# Patient Record
Sex: Male | Born: 1975 | Race: White | Hispanic: No | Marital: Married | Smoking: Never smoker
Health system: Southern US, Community
[De-identification: ages and names within clinical notes are randomized; demographics above are authoritative.]

## PROBLEM LIST (undated history)

## (undated) DIAGNOSIS — F419 Anxiety disorder, unspecified: Secondary | ICD-10-CM

## (undated) DIAGNOSIS — K219 Gastro-esophageal reflux disease without esophagitis: Secondary | ICD-10-CM

## (undated) HISTORY — PX: ELBOW SURGERY: SHX618

## (undated) HISTORY — DX: Anxiety disorder, unspecified: F41.9

## (undated) HISTORY — DX: Gastro-esophageal reflux disease without esophagitis: K21.9

---

## 2007-08-24 ENCOUNTER — Emergency Department (HOSPITAL_COMMUNITY): Admission: EM | Admit: 2007-08-24 | Discharge: 2007-08-24 | Payer: Self-pay | Admitting: Family Medicine

## 2007-09-23 ENCOUNTER — Ambulatory Visit: Payer: Self-pay | Admitting: Family Medicine

## 2007-09-23 DIAGNOSIS — F411 Generalized anxiety disorder: Secondary | ICD-10-CM | POA: Insufficient documentation

## 2007-09-23 DIAGNOSIS — E781 Pure hyperglyceridemia: Secondary | ICD-10-CM | POA: Insufficient documentation

## 2007-10-04 ENCOUNTER — Telehealth: Payer: Self-pay | Admitting: Family Medicine

## 2007-11-14 ENCOUNTER — Emergency Department (HOSPITAL_COMMUNITY): Admission: EM | Admit: 2007-11-14 | Discharge: 2007-11-14 | Payer: Self-pay | Admitting: Family Medicine

## 2007-11-30 ENCOUNTER — Telehealth (INDEPENDENT_AMBULATORY_CARE_PROVIDER_SITE_OTHER): Payer: Self-pay | Admitting: *Deleted

## 2008-02-11 ENCOUNTER — Telehealth: Payer: Self-pay | Admitting: Family Medicine

## 2008-02-11 ENCOUNTER — Ambulatory Visit: Payer: Self-pay | Admitting: Family Medicine

## 2008-02-14 ENCOUNTER — Emergency Department (HOSPITAL_COMMUNITY): Admission: EM | Admit: 2008-02-14 | Discharge: 2008-02-14 | Payer: Self-pay | Admitting: Family Medicine

## 2008-02-16 ENCOUNTER — Telehealth: Payer: Self-pay | Admitting: Psychology

## 2008-03-22 ENCOUNTER — Ambulatory Visit: Payer: Self-pay | Admitting: Family Medicine

## 2008-03-22 ENCOUNTER — Encounter: Payer: Self-pay | Admitting: Psychology

## 2008-03-22 DIAGNOSIS — F319 Bipolar disorder, unspecified: Secondary | ICD-10-CM | POA: Insufficient documentation

## 2008-03-23 LAB — CONVERTED CEMR LAB: TSH: 1.009 microintl units/mL (ref 0.350–4.50)

## 2008-05-04 ENCOUNTER — Ambulatory Visit: Payer: Self-pay | Admitting: Family Medicine

## 2008-06-06 ENCOUNTER — Telehealth: Payer: Self-pay | Admitting: Family Medicine

## 2008-07-24 ENCOUNTER — Telehealth: Payer: Self-pay | Admitting: Family Medicine

## 2008-08-07 ENCOUNTER — Ambulatory Visit: Payer: Self-pay | Admitting: Family Medicine

## 2008-09-15 ENCOUNTER — Encounter: Payer: Self-pay | Admitting: Family Medicine

## 2008-09-19 ENCOUNTER — Ambulatory Visit: Payer: Self-pay | Admitting: Family Medicine

## 2008-09-19 DIAGNOSIS — N453 Epididymo-orchitis: Secondary | ICD-10-CM | POA: Insufficient documentation

## 2008-09-19 LAB — CONVERTED CEMR LAB
Bilirubin Urine: NEGATIVE
Blood in Urine, dipstick: NEGATIVE
Glucose, Urine, Semiquant: NEGATIVE
Specific Gravity, Urine: 1.01
WBC Urine, dipstick: NEGATIVE
pH: 7

## 2008-09-20 ENCOUNTER — Telehealth: Payer: Self-pay | Admitting: *Deleted

## 2008-09-28 ENCOUNTER — Encounter: Payer: Self-pay | Admitting: Family Medicine

## 2008-09-28 ENCOUNTER — Ambulatory Visit: Payer: Self-pay | Admitting: Family Medicine

## 2008-09-28 LAB — CONVERTED CEMR LAB
ALT: 15 units/L (ref 0–53)
AST: 20 units/L (ref 0–37)
Creatinine, Ser: 0.97 mg/dL (ref 0.40–1.50)
LDL Cholesterol: 127 mg/dL — ABNORMAL HIGH (ref 0–99)
Sodium: 138 meq/L (ref 135–145)
Total Bilirubin: 0.8 mg/dL (ref 0.3–1.2)
Total CHOL/HDL Ratio: 6
VLDL: 34 mg/dL (ref 0–40)

## 2008-09-29 ENCOUNTER — Encounter: Payer: Self-pay | Admitting: Family Medicine

## 2008-10-05 ENCOUNTER — Ambulatory Visit: Payer: Self-pay | Admitting: Family Medicine

## 2008-12-20 ENCOUNTER — Ambulatory Visit: Payer: Self-pay | Admitting: Family Medicine

## 2008-12-20 ENCOUNTER — Telehealth: Payer: Self-pay | Admitting: Family Medicine

## 2009-06-11 ENCOUNTER — Telehealth: Payer: Self-pay | Admitting: Family Medicine

## 2009-11-05 ENCOUNTER — Ambulatory Visit: Payer: Self-pay | Admitting: Family Medicine

## 2010-04-05 ENCOUNTER — Ambulatory Visit (HOSPITAL_COMMUNITY): Admission: RE | Admit: 2010-04-05 | Discharge: 2010-04-05 | Payer: Self-pay | Admitting: Family Medicine

## 2010-04-05 ENCOUNTER — Ambulatory Visit: Payer: Self-pay | Admitting: Family Medicine

## 2010-04-05 ENCOUNTER — Telehealth (INDEPENDENT_AMBULATORY_CARE_PROVIDER_SITE_OTHER): Payer: Self-pay | Admitting: *Deleted

## 2010-04-05 DIAGNOSIS — R1011 Right upper quadrant pain: Secondary | ICD-10-CM | POA: Insufficient documentation

## 2010-04-08 ENCOUNTER — Ambulatory Visit: Payer: Self-pay | Admitting: Family Medicine

## 2010-04-08 LAB — CONVERTED CEMR LAB
Albumin: 4.9 g/dL (ref 3.5–5.2)
Alkaline Phosphatase: 50 units/L (ref 39–117)
CO2: 29 meq/L (ref 19–32)
Chloride: 103 meq/L (ref 96–112)
Glucose, Bld: 87 mg/dL (ref 70–99)
Lipase: 29 units/L (ref 0–75)
Potassium: 5.5 meq/L — ABNORMAL HIGH (ref 3.5–5.3)
RBC: 5.61 M/uL (ref 4.22–5.81)
Sodium: 143 meq/L (ref 135–145)
Total Protein: 7.9 g/dL (ref 6.0–8.3)
WBC: 6.4 10*3/uL (ref 4.0–10.5)

## 2010-04-10 ENCOUNTER — Telehealth: Payer: Self-pay | Admitting: Family Medicine

## 2010-04-11 ENCOUNTER — Encounter: Admission: RE | Admit: 2010-04-11 | Discharge: 2010-04-11 | Payer: Self-pay | Admitting: Family Medicine

## 2010-04-12 ENCOUNTER — Telehealth: Payer: Self-pay | Admitting: Family Medicine

## 2010-04-12 ENCOUNTER — Encounter: Admission: RE | Admit: 2010-04-12 | Discharge: 2010-04-12 | Payer: Self-pay | Admitting: Family Medicine

## 2010-06-27 ENCOUNTER — Encounter: Payer: Self-pay | Admitting: Sports Medicine

## 2010-06-27 ENCOUNTER — Ambulatory Visit
Admission: RE | Admit: 2010-06-27 | Discharge: 2010-06-27 | Payer: Self-pay | Source: Home / Self Care | Attending: Family Medicine | Admitting: Family Medicine

## 2010-07-16 NOTE — Progress Notes (Signed)
Summary: results  Phone Note Call from Patient Call back at [phone]   Caller: Patient Summary of Call: wants to know results of labs Initial call taken by: De Nurse,  April 10, 2010 9:19 AM  Follow-up for Phone Call        Told results were normal.  He is having more pain but not severe and no fever.  Has [place] in AM.  Told if severe or fever go to ER otherwise will contact him tomorro Follow-up by: Pearlean Brownie MD,  April 10, 2010 3:27 PM

## 2010-07-16 NOTE — Assessment & Plan Note (Signed)
Summary: still having pain rad to back/bmc   Vital Signs:  Patient profile:   35 year old male Height:      68 inches Weight:      179.5 pounds BMI:     27.39 Temp:     98.9 degrees F oral Pulse rate:   76 / minute BP sitting:   126 / 85  (left arm) Cuff size:   regular  Vitals Entered By: Garen Grams LPN (April 08, 2010 3:02 PM) CC: still having abd pain Is Patient Diabetic? No Pain Assessment Patient in pain? yes     Location: abdomen Intensity: 5   Primary Care Blu Lori:  Pearlean Brownie MD  CC:  still having abd pain.  History of Present Illness: RUQ Pain continues.  Took laxative and had bowel movements but no change in pain.  Xray reviewed.  Continues constant epigastic and right upper quadrant discomfort sometimes worse than others.  Eating well with normal appetite no nausea or vomiting or diarrhea or fever or jaundice.  No unsual recent injestions or change in diet. No NSAIDS.  No family history of GB disease  Eats almost no fat  ROS - as above PMH - Medications reviewed and updated in medication list.  Smoking Status noted in VS form    Habits & Providers  Alcohol-Tobacco-Diet     Alcohol drinks/day: <1     Alcohol type: beer     Tobacco Status: never     Passive Smoke Exposure: no  Current Medications (verified): 1)  Alprazolam 0.5 Mg Tabs (Alprazolam) .Marland Kitchen.. 1 By Mouth Two Times A Day As Needed 2)  Nexium 20 Mg  Cpdr (Esomeprazole Magnesium) .Marland Kitchen.. 1 By Mouth Daily 3)  Lamictal 200 Mg Tabs (Lamotrigine) .... One Pill Daily. 4)  Tramadol Hcl 50 Mg Tabs (Tramadol Hcl) .Marland Kitchen.. 1-2 By Mouth Q 6 Hrs As Needed Pain  Allergies: 1)  ! Erythromycin  Physical Exam  General:  Well-developed,well-nourished,in no acute distress; alert,appropriate and cooperative throughout examination Eyes:  no jaundcie  Abdomen:  TTP in RUQ with + McMurrays sign.  No masses or gaurding or rebound    Impression & Recommendations:  Problem # 1:  RUQ PAIN  (ICD-789.01) suspect GB disease but also check for liver and pancreas and possible UGI bleed with anemia.   Orders: Comp Met-[facility] [phone]) CBC-[facility] (09811) Lipase-[facility] [phone]) Ultrasound (Ultrasound) [facility]- Est  Level 4 (13086)  Complete Medication List: 1)  Alprazolam 0.5 Mg Tabs (Alprazolam) .Marland Kitchen.. 1 by mouth two times a day as needed 2)  Nexium 20 Mg Cpdr (Esomeprazole magnesium) .Marland Kitchen.. 1 by mouth daily 3)  Lamictal 200 Mg Tabs (Lamotrigine) .... One pill daily. 4)  Tramadol Hcl 50 Mg Tabs (Tramadol hcl) .Marland Kitchen.. 1-2 by mouth q 6 hrs as needed pain  Patient Instructions: 1)  Call immediately if you get severe unremitting pain or fever or persistent nausea or vomiting 2)  I will call you with the results of your test  Prescriptions: TRAMADOL HCL 50 MG TABS (TRAMADOL HCL) 1-2 by mouth q 6 hrs as needed pain  #30 x 1   Entered and Authorized by:   Pearlean Brownie MD   Signed by:   Pearlean Brownie MD on 04/08/2010   Method used:   Electronically to        [facility]* (retail)       [address]       [address] Shipping/mailing  [place], [place]  [address]       Ph: [phone]       Fax: [phone]   RxID:   [id number]    Orders Added: 1)  Comp Met-[facility] [84132-44010] 2)  CBC-[facility] [85027] 3)  Lipase-[facility] [[id number]] 4)  Ultrasound [Ultrasound] 5)  [facility]- Est  Level 4 [27253]

## 2010-07-16 NOTE — Assessment & Plan Note (Signed)
Summary: cpe,df   Vital Signs:  Patient profile:   35 year old male Height:      67 inches Weight:      185.8 pounds BMI:     29.21 Temp:     98.8 degrees F oral Pulse rate:   66 / minute BP sitting:   120 / 78  (left arm) Cuff size:   regular  Vitals Entered By: Garen Grams LPN (Nov 05, 2009 9:49 AM) CC: cpe Is Patient Diabetic? No Pain Assessment Patient in pain? no        Primary Care Provider:  Pearlean Brownie MD  CC:  cpe.  History of Present Illness: Current Problems:  PREVENTIVE HEALTH CARE (ICD-V70.0) Feels well exercising daily. About to run a marathon  OTHER AND UNSPECIFIED BIPOLAR DISORDERS (ICD-296.89) under good control.  No anger episodes or violence.  Exercising helps. Takes lamictal daily  HYPERTRIGLYCERIDEMIA (ICD-272.1) last blood test was 199.  working on diet. No chest pain or claudication  ANXIETY STATE NOS (ICD-300.00) weaning down xanax to two times a day as needed.  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  Smoking Status noted in VS form      Habits & Providers  Alcohol-Tobacco-Diet     Tobacco Status: never  Current Medications (verified): 1)  Alprazolam 0.5 Mg Tabs (Alprazolam) .Marland Kitchen.. 1 By Mouth Two Times A Day As Needed 2)  Nexium 20 Mg  Cpdr (Esomeprazole Magnesium) .Marland Kitchen.. 1 By Mouth Daily 3)  Lamictal 200 Mg Tabs (Lamotrigine) .... One Pill Daily.  Allergies: 1)  ! Erythromycin  Social History: Works at [employer] at [employer] at [employer].  Dtrs Zoe 02 and [place] 06  Review of Systems  The patient denies anorexia, fever, weight gain, vision loss, decreased hearing, hoarseness, chest pain, syncope, dyspnea on exertion, peripheral edema, prolonged cough, headaches, hemoptysis, abdominal pain, melena, hematochezia, severe indigestion/heartburn, hematuria,  incontinence, genital sores, muscle weakness, suspicious skin lesions, transient blindness, difficulty walking, depression, unusual weight change, abnormal bleeding, enlarged lymph nodes, angioedema, and testicular masses.    Physical Exam  General:  Well-developed,well-nourished,in no acute distress; alert,appropriate and cooperative throughout examination Head:  Normocephalic and atraumatic without obvious abnormalities. No apparent alopecia or balding. Eyes:  No corneal or conjunctival inflammation noted. EOMI. Perrla. Funduscopic exam benign, without hemorrhages, exudates or papilledema. Vision grossly normal. Ears:  External ear exam shows no significant lesions or deformities.  Otoscopic examination reveals clear canals, tympanic membranes are intact bilaterally without bulging, retraction, inflammation or discharge. Hearing is grossly normal bilaterally. Nose:  External nasal examination shows no deformity or inflammation. Nasal mucosa are pink and moist without lesions or exudates. Mouth:  Oral mucosa and oropharynx without lesions or exudates.  Teeth in good repair. Neck:  No deformities, masses, or tenderness noted. Lungs:  Normal respiratory effort, chest expands symmetrically. Lungs are clear to auscultation, no crackles or wheezes. Heart:  Normal rate and regular rhythm. S1 and S2 normal without gallop, murmur, click, rub or other extra sounds. Abdomen:  Bowel sounds positive,abdomen soft and non-tender without masses, organomegaly or hernias noted. Msk:  No deformity or scoliosis noted of thoracic or lumbar spine.   Extremities:  No clubbing, cyanosis, edema, or deformity noted with normal full range of motion of all joints.   Skin:  Intact without suspicious lesions or rashes Cervical Nodes:  No lymphadenopathy noted  Psych:  Cognition and judgment appear intact. Alert and cooperative with normal attention span and concentration. No apparent delusions, illusions,  hallucinations   Impression & Recommendations:  Problem # 1:  PREVENTIVE HEALTH CARE (ICD-V70.0)  Healthy great work on losing weight (> 100 lbs).  His hypertension has resolved   Orders: [facility] - Est  18-39 yrs ([address])  Problem # 2:  ANXIETY STATE NOS (ICD-300.00) doing well will decrease xanax frequency  His updated medication list for this problem includes:    Alprazolam 0.5 Mg Tabs (Alprazolam) .Marland Kitchen... 1 by mouth two times a day as needed  Problem # 3:  OTHER AND UNSPECIFIED BIPOLAR DISORDERS (ICD-296.89) Stable on lamictal Discussed possible weaning in the future   Complete Medication List: 1)  Alprazolam 0.5 Mg Tabs (Alprazolam) .Marland Kitchen.. 1 by mouth two times a day as needed 2)  Nexium 20 Mg Cpdr (Esomeprazole magnesium) .Marland Kitchen.. 1 by mouth daily 3)  Lamictal 200 Mg Tabs (Lamotrigine) .... One pill daily.  Patient Instructions: 1)  Keep up the great work Prescriptions: ALPRAZOLAM 0.5 MG TABS (ALPRAZOLAM) 1 by mouth two times a day as needed  #60 x 6   Entered and Authorized by:   Pearlean Brownie MD   Signed by:   Pearlean Brownie MD on 11/05/2009   Method used:   Handwritten   RxID:   [id number]

## 2010-07-16 NOTE — Progress Notes (Signed)
Summary: Test Res  Phone Note Call from Patient Call back at [phone]   Caller: Patient Summary of Call: Pt checking on results of test from today? Initial call taken by: Clydell Hakim,  April 12, 2010 4:00 PM  Follow-up for Phone Call        will forward to MD. Follow-up by: Theresia Lo RN,  April 12, 2010 4:26 PM  Additional Follow-up for Phone Call Additional follow up Details #1::        Spoke with him about CT.  PAin is better but lots of diarreha.  Recommend pepto bismol 2 qid.  Call if fever or bleedingn or worse pain. or if not better by monday

## 2010-07-16 NOTE — Progress Notes (Signed)
Summary: triage  Phone Note Call from Patient Call back at [phone]   Caller: Patient Summary of Call: Intense stomach pain for 3 days asking to be seen today. Initial call taken by: Clydell Hakim,  April 05, 2010 9:12 AM  Follow-up for Phone Call         patient states pain is  right upper abdomen . rates pain at 5 now. wakes him up at night. vomited one time 3 nights ago and has had  nausea since. appointment scheduled today, Follow-up by: Theresia Lo RN,  April 05, 2010 9:43 AM

## 2010-07-16 NOTE — Assessment & Plan Note (Signed)
Summary: upper abdominal pain  nausea/ls   Vital Signs:  Patient profile:   35 year old male Height:      68 inches Weight:      182 pounds BMI:     27.77 Temp:     98.6 degrees F BP sitting:   116 / 77  (right arm) Cuff size:   regular  Vitals Entered By: Dennison Nancy RN (April 05, 2010 1:31 PM) CC: [place] for RUQ pain Is Patient Diabetic? No Pain Assessment Patient in pain? yes     Location: RUQ Intensity: 5 Onset of pain  This past Tuesday   Primary Care Provider:  Pearlean Brownie MD  CC:  [place] for RUQ pain.  History of Present Illness: 1. RUQ pain:  Pt presents to clinic for evaluation of his right upper abdominal pain.  He initally had some nausea and threw up once on Tuesday evening.  Shortly after that the pain started Tuesday night and woke him up from bed.  The pain is constant.  Described as a "knot" in his stomach.  It is about the same as it was on Tuesday.  Currently rated a 4-5/10.  He hasn't had a BM since Sunday.  He normally has BM's everyday.   He is still eating and drinking normally and has an appetite.  Nothing makes it better.  Nothing makes it worse.  ROS: denies fevers, GERD, diarrhea, chest pain, shortness of breath, back pain, dysuria, hematuria.   Habits & Providers  Alcohol-Tobacco-Diet     Tobacco Status: never  Current Medications (verified): 1)  Alprazolam 0.5 Mg Tabs (Alprazolam) .Marland Kitchen.. 1 By Mouth Two Times A Day As Needed 2)  Nexium 20 Mg  Cpdr (Esomeprazole Magnesium) .Marland Kitchen.. 1 By Mouth Daily 3)  Lamictal 200 Mg Tabs (Lamotrigine) .... One Pill Daily. 4)  Magnesium Citrate 1.745 Gm/52ml Soln (Magnesium Citrate) .... Drink One Bottle Daily Until Bowel Movement  Allergies: 1)  ! Erythromycin  Past History:  Past Medical History: Reviewed history from 09/23/2007 and no changes required. 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 Works at [employer] at [employer] at [employer].  Dtrs Zoe 02 and [place] 06  Physical Exam  General:  Vitals reviewed.  Afebrile.  Comfortable appearing.  Walking into clinic without difficulty.  No acute distress. Lungs:  Normal respiratory effort, chest expands symmetrically. Lungs are clear to auscultation, no crackles or wheezes. Heart:  Normal rate and regular rhythm. S1 and S2 normal without gallop, murmur, click, rub or other extra sounds. Abdomen:  Non-distended.  No gross deformity.  TTP in RUQ.  Otherwise non-tender.  Normal bowel sounds.  No guarding. soft, no rigidity, no rebound tenderness, and no hepatomegaly.   Neurologic:  alert & oriented X3 and gait normal.   Skin:  turgor normal, color normal, and no rashes.   Psych:  not anxious appearing.     Impression & Recommendations:  Problem # 1:  RUQ PAIN (ICD-789.01) Assessment New DDX includes constipation, gallbladder, or liver pathology.  Working diagnosis is constipation since he hasn't had a BM in nearly a week and he is normally regular.  Will get abdominal x-ray to help confirm diagnosis.  Will attempt to get bowel moving with Mag Citrate.  If not better with BM  would consider RUQ u/s or CT scan to evaluate biliary tree.  Precautions for worsening symptoms given to pt. Orders: Diagnostic X-Ray/Fluoroscopy (Diagnostic X-Ray/Flu) [facility]- Est  Level 4 (88416)  Complete Medication List: 1)  Alprazolam 0.5 Mg Tabs (Alprazolam) .Marland Kitchen.. 1 by mouth two times a day as needed 2)  Nexium 20 Mg Cpdr (Esomeprazole magnesium) .Marland Kitchen.. 1 by mouth daily 3)  Lamictal 200 Mg Tabs (Lamotrigine) .... One pill daily. 4)  Magnesium Citrate 1.745 Gm/26ml Soln (Magnesium citrate) .... Drink one bottle daily until bowel movement  Patient Instructions: 1)  There are a couple things that this pain could be. 2)  Right now, I  am thinking that it is related to constipation 3)  I would like to see how you feel after having a couple bowel movements 4)  If not better with a bowel movement please return to clinic 5)  If you start developing worsening abdominal pain or if you are unable to eat or drink then please return to clinic or go to the ED 6)  Please schedule a follow up early next week to check on you   Orders Added: 1)  Diagnostic X-Ray/Fluoroscopy [Diagnostic X-Ray/Flu] 2)  [facility]- Est  Level 4 [60630]

## 2010-07-18 ENCOUNTER — Ambulatory Visit (INDEPENDENT_AMBULATORY_CARE_PROVIDER_SITE_OTHER): Payer: BC Managed Care – PPO | Admitting: Family Medicine

## 2010-07-18 ENCOUNTER — Encounter: Payer: Self-pay | Admitting: Family Medicine

## 2010-07-18 DIAGNOSIS — R079 Chest pain, unspecified: Secondary | ICD-10-CM | POA: Insufficient documentation

## 2010-07-18 NOTE — Assessment & Plan Note (Signed)
Summary: reocurring pain under rt rib cage/chambliss/bmc   Vital Signs:  Patient profile:   35 year old male Height:      68 inches Weight:      184 pounds Temp:     98.3 degrees F oral Pulse rate:   66 / minute Pulse rhythm:   regular BP sitting:   131 / 80  (right arm) Cuff size:   regular  Vitals Entered By: Loralee Pacas CMA (June 27, 2010 3:10 PM) CC: continued pain in rib cage Pain Assessment Patient in pain? yes     Location: rib Intensity: 7 Type: sharp Onset of pain  Constant Comments pt stated that he was seen here in oct for the same thing and it went away but the pain has started up again   Primary Care Provider:  Pearlean Brownie MD  CC:  continued pain in rib cage.  History of Present Illness: 35 yo male with RUQ pain.  This has been present  ~ 2 months now, has had labs, [place], CT abdomen all which did not reveal a source of his pain.  He has no N/V/D/C currently.  Doesn't note anything other than palpation that worsens his pain.  It is not affected by deep breathing.  He does work out every single day and has lost approx 80 pounds doing so.  This does not exacerbate his pain either.  He has been taking tramadol which doesn't help.  No fevers/chills, no rashes.  Habits & Providers  Alcohol-Tobacco-Diet     Alcohol drinks/day: <1     Alcohol type: beer     Tobacco Status: never     Passive Smoke Exposure: no  Exercise-Depression-Behavior     Have you felt down or hopeless? no     Have you felt little pleasure in things? no     Depression Counseling: not indicated; screening negative for depression     Seat Belt Use: always  Current Medications (verified): 1)  Alprazolam 0.5 Mg Tabs (Alprazolam) .Marland Kitchen.. 1 By Mouth Two Times A Day As Needed 2)  Nexium 20 Mg  Cpdr (Esomeprazole Magnesium) .Marland Kitchen.. 1 By Mouth Daily 3)  Lamictal 200 Mg Tabs (Lamotrigine) .... One Pill Daily. 4)  Mobic 15 Mg Tabs (Meloxicam) .... One Tab By Mouth Daily For Pain.  Allergies  (verified): 1)  ! Erythromycin  Social History: Risk analyst Use:  always  Review of Systems       See HPI  Physical Exam  General:  Well-developed,well-nourished,in no acute distress; alert,appropriate and cooperative throughout examination Abdomen:  Noted pain in RUQ.  On further evaluation pain is located directly over his 10th costal cartilage and neither just below over the soft tissue/liver/GB, nor just above over the intercostal muscle.  Otherwise neg murphys sign, abd soft, NT, good BS, no masses.   Impression & Recommendations:  Problem # 1:  RUQ PAIN (ICD-789.01) Assessment Unchanged Pain is directly over the costal cartilage. Suggestive of costochondritis, this can take up to 12 weeks to resolve. CT abd, exam, and labs rule out life-threatening visceral pathology. Mobic 15mg  daily. RTC if no better in 1-2 weeks.  Orders: [facility]- Est Level  3 ([address]40)  Complete Medication List: 1)  Alprazolam 0.5 Mg Tabs (Alprazolam) .Marland Kitchen.. 1 by mouth two times a day as needed 2)  Nexium 20 Mg Cpdr (Esomeprazole magnesium) .Marland Kitchen.. 1 by mouth daily 3)  Lamictal 200 Mg Tabs (Lamotrigine) .... One pill daily. 4)  Mobic 15 Mg Tabs (Meloxicam) .... One tab  by mouth daily for pain.  Patient Instructions: 1)  See handout. 2)  Pain should respond to mobic. 3)  Come back if no better in a week with daily medication. 4)  -Dr. Karie Schwalbe. Prescriptions: MOBIC 15 MG TABS (MELOXICAM) One tab by mouth daily for pain.  #14 x 0   Entered and Authorized by:   Rodney Langton MD   Signed by:   Rodney Langton MD on 06/27/2010   Method used:   Electronically to        [facility]* (retail)       [address]       [address]. Shipping/mailing       [place], [place]  [address]       Ph: [phone]       Fax: [phone]   RxID:   [id number]    Orders Added: 1)  [facility]- Est Level  3 [99213]  Appended Document: reocurring pain under rt rib cage/chambliss/bmc I would like him  to continue working out, he should however limit abdominal exercises/crunches, and exercises that would involve protraction of the scapula such as pushups and bench press as this would cause serratus anterior pull on the ribs.

## 2010-07-18 NOTE — Letter (Signed)
Summary: Handout Printed  Printed Handout:  - Costochondritis 

## 2010-07-24 NOTE — Assessment & Plan Note (Signed)
Summary: side pain,df   Vital Signs:  Patient profile:   35 year old male Height:      68 inches Weight:      186.8 pounds BMI:     28.51 Temp:     98.2 degrees F oral Pulse rate:   64 / minute BP sitting:   131 / 78  (left arm) Cuff size:   regular  Vitals Entered By: Jimmy Footman, CMA (July 18, 2010 2:40 PM) CC: still having right sided pain Is Patient Diabetic? No Pain Assessment Patient in pain? yes        Primary Provider:  Pearlean Brownie MD  CC:  still having right sided pain.  History of Present Illness: 1. Right Rib Pain patient has diagnosis of costochondritis from a visit one week ago. Dr. Karie Schwalbe used Mobic to help decrease inflammation and treat pain. The patient states this is not helping. He has had this pain off and on since october with prior imaging that doesnt reveal any pathology that could cuase this pain. He has a 2 mm rt. ureteral calculi, but that is not the cause. On exam he has focal tenderness at the costo-chondral junction of the 10th true rib. 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  Allergies: 1)  ! Erythromycin  Review of Systems       no fever, no arthralgias, no tick bites, no rashs. see HPI  Physical Exam  Abdomen:  Bowel sounds positive,abdomen soft and non-tender without masses, organomegaly or hernias noted. 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 F/U in a week.  Orders: [facility]- Est Level  3 ([address]86)  Complete Medication List: 1)  Alprazolam 0.5 Mg Tabs (Alprazolam) .Marland Kitchen.. 1 by mouth two times a day as needed 2)  Nexium 20 Mg Cpdr  (Esomeprazole magnesium) .Marland Kitchen.. 1 by mouth daily 3)  Lamictal 200 Mg Tabs (Lamotrigine) .... One pill daily. 4)  Mobic 15 Mg Tabs (Meloxicam) .... One tab by mouth daily for pain. 5)  Prednisone 20 Mg Tabs (Prednisone) .... Take 3 tabs by mouth daily for 5 days 6)  Synera 70-70 Mg Ptch (Lidocaine-tetracaine) .... Apply patch to right ribcage every 12 hours as needed pain Prescriptions: SYNERA 70-70 MG PTCH (LIDOCAINE-TETRACAINE) apply patch to right ribcage every 12 hours as needed pain  #28 patches x 0   Entered and Authorized by:   Edd Arbour   Signed by:   Edd Arbour on 07/18/2010   Method used:   Electronically to        [facility]* (retail)       [address]       [address] Shipping/mailing       [place], [place]  [address]       Ph: [phone]       Fax: [phone]   RxID:   [id number] PREDNISONE 20 MG TABS (PREDNISONE) take 3 tabs by mouth daily for 5 days  #15 tabs x 0   Entered and Authorized by:   Edd Arbour   Signed by:   Edd Arbour on  07/18/2010   Method used:   Electronically to        [facility]* (retail)       [address]       [address] Shipping/mailing       [place], [place]  [address]       Ph: [phone]       Fax: [phone]   RxID:   [id number]    Orders Added: 1)  [facility]- Est Level  3 [[address]132]

## 2010-08-02 ENCOUNTER — Ambulatory Visit (INDEPENDENT_AMBULATORY_CARE_PROVIDER_SITE_OTHER): Payer: BC Managed Care – PPO | Admitting: Family Medicine

## 2010-08-02 ENCOUNTER — Encounter: Payer: Self-pay | Admitting: Family Medicine

## 2010-08-02 VITALS — BP 148/84 | HR 64 | Temp 98.3°F | Ht 66.75 in | Wt 185.0 lb

## 2010-08-02 DIAGNOSIS — J069 Acute upper respiratory infection, unspecified: Secondary | ICD-10-CM | POA: Insufficient documentation

## 2010-08-02 MED ORDER — BENZONATATE 100 MG PO CAPS: 100.0000 mg | ORAL_CAPSULE | Freq: Four times a day (QID) | ORAL | Status: DC | PRN

## 2010-08-02 MED ORDER — AMOXICILLIN 500 MG PO CAPS: 500.0000 mg | ORAL_CAPSULE | Freq: Two times a day (BID) | ORAL | Status: AC

## 2010-08-02 NOTE — Progress Notes (Signed)
  Subjective:    Patient ID: Dominic Gay, male    DOB: [birth year], 35 y.o.   MRN: [id number]  URI  This is a new problem. The current episode started in the past 7 days. The problem has been unchanged. The maximum temperature recorded prior to his arrival was 101 - 101.9 F. The fever has been present for 1 to 2 days. Associated symptoms include congestion, coughing, headaches, rhinorrhea and sinus pain. Pertinent negatives include no abdominal pain, chest pain, diarrhea, dysuria, ear pain, joint pain, joint swelling, nausea, neck pain, plugged ear sensation, rash, sneezing, sore throat, swollen glands, vomiting or wheezing. Treatments tried: Dayquil/Nyquil. The treatment provided mild relief.      Review of Systems  HENT: Positive for congestion and rhinorrhea. Negative for ear pain, sore throat, sneezing and neck pain.   Respiratory: Positive for cough. Negative for wheezing.   Cardiovascular: Negative for chest pain.  Gastrointestinal: Negative for nausea, vomiting, abdominal pain and diarrhea.  Genitourinary: Negative for dysuria.  Musculoskeletal: Negative for joint pain.  Skin: Negative for rash.  Neurological: Positive for headaches.       Objective:   Physical Exam Gen: vitals reviewed, NAD, well appearing Head: Minimal sinus tenderness Nose: clear rhinorrhea Mouth: Tonsils swollen bilaterally.  No exudate Neck: shotty anterior cervical LAD Resp: CTAB, nml WOB CV: RRR no murmur Abd: S / NT / ND  +BS Ext: No c/c/e       Assessment & Plan:

## 2010-08-02 NOTE — Assessment & Plan Note (Signed)
Likely viral URI.  His daughter was just diagnosed with strep throat and he did have swollen tonsils.  He also has some sinus tenderness.  Will treat symptomatically for now.  Provided Rx for abx. If not better by next week.

## 2010-09-17 ENCOUNTER — Other Ambulatory Visit: Payer: Self-pay | Admitting: Family Medicine

## 2010-09-17 NOTE — Telephone Encounter (Signed)
Refill request

## 2010-09-26 ENCOUNTER — Encounter: Payer: Self-pay | Admitting: Family Medicine

## 2010-09-26 ENCOUNTER — Ambulatory Visit (INDEPENDENT_AMBULATORY_CARE_PROVIDER_SITE_OTHER): Payer: BC Managed Care – PPO | Admitting: Family Medicine

## 2010-09-26 VITALS — BP 141/89 | HR 88 | Temp 98.5°F | Ht 68.0 in | Wt 187.2 lb

## 2010-09-26 DIAGNOSIS — M79673 Pain in unspecified foot: Secondary | ICD-10-CM | POA: Insufficient documentation

## 2010-09-26 DIAGNOSIS — M79609 Pain in unspecified limb: Secondary | ICD-10-CM

## 2010-09-26 NOTE — Assessment & Plan Note (Signed)
2/2 calcaneal stress reaction vs stress fracture.  Nondisplaced.  Given management will not change to discern between the two, will not proceed with MRI or bone scan unless he does not improve as expected.  Cam walker for comfort with heel lift - offered crutches but he declined these.  No running in meantime.  Icing, elevation, tylenol for pain.  F/u in 2-3 weeks for reevaluation, repeat ultrasound.  Depending on exam at that time may start strengthening exercises, cross training.  See instructions for further.

## 2010-09-26 NOTE — Patient Instructions (Signed)
Your exam and ultrasound are consistent with a stress reaction/fracture of your calcaneus. Wear the boot regularly when up and walking around - do not have to sleep in this. Ice the area 15 minutes at a time 3-4 times a day. Elevate above the level of your heart when possible to help with swelling. No running or lower extremity exercise while we allow this to heal. Tylenol as needed for pain. Can use crutches if unable to bear weight comfortably with the boot. Follow up with me in 2-3 weeks for a recheck on your progress.

## 2010-09-26 NOTE — Progress Notes (Signed)
Subjective:    Patient ID: Dominic Gay, male    DOB: [birth year], 35 y.o.   MRN: [id number]  HPI 35 yo M here with left heel pain  Patient reports that about 2 weeks ago he was running in barefoot shoes (noticed he is a heel striker) when he started to develop mostly lateral left heel pain that significantly worsened after his run. Is training for a race on April 28th. Tried to run on a treadmill once after this on the next day but was unable to do so because of sharp pain in this area. + swelling but no bruising. Has been limping since with mild improvement Has been icing and taking some ibuprofen. Pain not worse with any specific ankle motions. He runs about 5 miles a day every day (roughly 35 miles a week) but new to using barefoot shoes. Has not exercised since the day after this started hurting.  Past Medical History  Diagnosis Date  . Anxiety   . GERD (gastroesophageal reflux disease)     Current Outpatient Prescriptions on File Prior to Visit  Medication Sig Dispense Refill  . ALPRAZolam (XANAX) 0.5 MG tablet 1 by mouth two times a day as needed       . esomeprazole (NEXIUM) 20 MG capsule Take 20 mg by mouth daily.        Marland Kitchen lamoTRIgine (LAMICTAL) 200 MG tablet Take 200 mg by mouth daily.        . traMADol (ULTRAM) 50 MG tablet Take 1 tablet (50 mg total) by mouth every 6 (six) hours as needed for pain.  60 tablet  1  . DISCONTD: benzonatate (TESSALON PERLES) 100 MG capsule Take 1 capsule (100 mg total) by mouth every 6 (six) hours as needed for Cough.  30 capsule  0  . DISCONTD: meloxicam (MOBIC) 15 MG tablet Take 15 mg by mouth daily.          History reviewed. No pertinent past surgical history.  Allergies  Allergen Reactions  . Erythromycin     History   Social History  . Marital Status: Married    Spouse Name: N/A    Number of Children: N/A  . Years of Education: N/A   Occupational History  . Not on file.   Social History Main Topics  . Smoking  status: Never Smoker   . Smokeless tobacco: Never Used  . Alcohol Use: Not on file  . Drug Use: Not on file  . Sexually Active: Not on file   Other Topics Concern  . Not on file   Social History Narrative  . No narrative on file    Family History  Problem Relation Age of Onset  . Diabetes Neg Hx   . Heart attack Neg Hx   . Hypertension Neg Hx     BP 141/89  Pulse 88  Temp(Src) 98.5 F (36.9 C) (Oral)  Ht 5\' 8"  (1.727 m)  Wt 187 lb 3.2 oz (84.913 kg)  BMI 28.46 kg/m2  Review of Systems See HPI above.    Objective:   Physical Exam Gen: NAD L ankle/foot: Mild swelling lateral ankle compared to right (over lateral calcaneus).  No other deformity, bruising, erythema. Mod-severe TTP lateral calcaneus with mild TTP peroneal tendons as cross lat malleolus.  No TTP plantar fascia, plantar medial insertion of plantar fascia.  No TTP base 5th, navicular, med/lat malleoli, fibular head. FROM ankle with 5/5 strength.  No pain with resisted ankle motions. + squeeze test. Unable to  do hop test.     MSK u/s: L ankle shows mild bony irregularity lateral calcaneus not present on R.  Significant neovascularity down to cortex of calcaneus on left also not seen on right ankle on doppler.  No displacement of bone visualized.  Assessment & Plan:  1. L heel pain - 2/2 calcaneal stress reaction vs stress fracture.  Nondisplaced.  Given management will not change to discern between the two, will not proceed with MRI or bone scan unless he does not improve as expected.  Cam walker for comfort with heel lift - offered crutches but he declined these.  No running in meantime.  Icing, elevation, tylenol for pain.  F/u in 2-3 weeks for reevaluation, repeat ultrasound.  Depending on exam at that time may start strengthening exercises, cross training.  See instructions for further.

## 2010-10-17 ENCOUNTER — Ambulatory Visit: Payer: BC Managed Care – PPO | Admitting: Family Medicine

## 2010-10-18 ENCOUNTER — Encounter: Payer: Self-pay | Admitting: Family Medicine

## 2010-10-18 ENCOUNTER — Ambulatory Visit (INDEPENDENT_AMBULATORY_CARE_PROVIDER_SITE_OTHER): Payer: BC Managed Care – PPO | Admitting: Family Medicine

## 2010-10-18 VITALS — BP 110/72 | HR 71 | Ht 68.0 in | Wt 180.0 lb

## 2010-10-18 DIAGNOSIS — M79673 Pain in unspecified foot: Secondary | ICD-10-CM

## 2010-10-18 DIAGNOSIS — M79609 Pain in unspecified limb: Secondary | ICD-10-CM

## 2010-10-18 NOTE — Patient Instructions (Signed)
You are healing well from your calcaneal stress fracture/reaction. May 24th will be 6 weeks - follow up with me at or just after this date. Continue with icing after workouts. Tylenol as needed. No running, jogging, elliptical for exercise. Ok to bike, lift weights, swim at this point If pain gets more than a 3/10, stop doing that exercise. Do theraband exercises and calf raises 3 sets of 10 once a day for 4-5 days a week.

## 2010-10-18 NOTE — Progress Notes (Signed)
Subjective:    Patient ID: Dominic Gay, male    DOB: [birth year], 35 y.o.   MRN: [id number]  HPI  35 yo M here for 3 week f/u left calcaneal stress fracture.  Last OV: Patient reports that about 2 weeks ago he was running in barefoot shoes (noticed he is a heel striker) when he started to develop mostly lateral left heel pain that significantly worsened after his run. Is training for a race on April 28th. Tried to run on a treadmill once after this on the next day but was unable to do so because of sharp pain in this area. + swelling but no bruising. Has been limping since with mild improvement Has been icing and taking some ibuprofen. Pain not worse with any specific ankle motions. He runs about 5 miles a day every day (roughly 35 miles a week) but new to using barefoot shoes. Has not exercised since the day after this started hurting.  Today: Patient reports he is about 80% improved. Has not done any running or cross training Required cam walker with heel lift until last weekend - now walking without pain without it Stopped icing 2 weeks ago Takes tylenol as needed. Belongs to a gym and can cross train when allowed.  Past Medical History  Diagnosis Date  . Anxiety   . GERD (gastroesophageal reflux disease)     Current Outpatient Prescriptions on File Prior to Visit  Medication Sig Dispense Refill  . ALPRAZolam (XANAX) 0.5 MG tablet 1 by mouth two times a day as needed       . esomeprazole (NEXIUM) 20 MG capsule Take 20 mg by mouth daily.        Marland Kitchen lamoTRIgine (LAMICTAL) 200 MG tablet Take 200 mg by mouth daily.        . traMADol (ULTRAM) 50 MG tablet Take 1 tablet (50 mg total) by mouth every 6 (six) hours as needed for pain.  60 tablet  1    History reviewed. No pertinent past surgical history.  Allergies  Allergen Reactions  . Erythromycin     History   Social History  . Marital Status: Married    Spouse Name: N/A    Number of Children: N/A  . Years of  Education: N/A   Occupational History  . Not on file.   Social History Main Topics  . Smoking status: Never Smoker   . Smokeless tobacco: Never Used  . Alcohol Use: Not on file  . Drug Use: Not on file  . Sexually Active: Not on file   Other Topics Concern  . Not on file   Social History Narrative  . No narrative on file    Family History  Problem Relation Age of Onset  . Diabetes Neg Hx   . Heart attack Neg Hx   . Hypertension Neg Hx     BP 110/72  Pulse 71  Ht 5\' 8"  (1.727 m)  Wt 180 lb (81.647 kg)  BMI 27.37 kg/m2  Review of Systems  See HPI above.    Objective:   Physical Exam  Gen: NAD L ankle/foot: No swelling, deformity, bruising, erythema. Mild TTP lateral calcaneus - significantly improved. no TTP peroneal tendons, plantar fascia, plantar medial insertion of plantar fascia.  No TTP base 5th, navicular, med/lat malleoli, fibular head. FROM ankle. Negative squeeze test. Did not retest hop test.     MSK u/s: L ankle shows smoothing of prior bony irregularity lateral calcaneus though not completely healed.  Neovascularity no longer present at bony cortex - much improved.  No displacement of bone visualized.  Assessment & Plan:  1. L heel pain - 2/2 calcaneal stress reaction vs stress fracture.  Nondisplaced.  Much improved but still with mild pain.  Anticipate total 6-8 weeks for complete healing.  Start ankle exercises and calf raises for strengthening.  Cross train with swimming, biking but still no running, jogging, or elliptical.  Ice after activity.  Tylenol as needed for pain.  F/u when 6 weeks out from diagnosis for reevaluation.  Hopefully can institute walk/jog program at that time.

## 2010-10-18 NOTE — Assessment & Plan Note (Signed)
2/2 calcaneal stress reaction vs stress fracture.  Nondisplaced.  Much improved but still with mild pain.  Anticipate total 6-8 weeks for complete healing.  Start ankle exercises and calf raises for strengthening.  Cross train with swimming, biking but still no running, jogging, or elliptical.  Ice after activity.  Tylenol as needed for pain.  F/u when 6 weeks out from diagnosis for reevaluation.  Hopefully can institute walk/jog program at that time.

## 2010-11-07 ENCOUNTER — Ambulatory Visit (INDEPENDENT_AMBULATORY_CARE_PROVIDER_SITE_OTHER): Payer: BC Managed Care – PPO | Admitting: Family Medicine

## 2010-11-07 ENCOUNTER — Encounter: Payer: Self-pay | Admitting: Family Medicine

## 2010-11-07 VITALS — BP 128/78 | HR 64 | Temp 98.1°F | Ht 68.0 in | Wt 180.0 lb

## 2010-11-07 DIAGNOSIS — M79673 Pain in unspecified foot: Secondary | ICD-10-CM

## 2010-11-07 DIAGNOSIS — M79609 Pain in unspecified limb: Secondary | ICD-10-CM

## 2010-11-07 NOTE — Assessment & Plan Note (Signed)
Calcaneal stress fracture - now completely healed without recurrence of pain.  Discussed increasing activities by 10% each week.  Cross train on off days.  Ice, tylenol as needed.  Wearing more supportive running shoe now.  F/u prn.

## 2010-11-07 NOTE — Progress Notes (Signed)
Subjective:    Patient ID: Dominic Gay, male    DOB: [birth year], 35 y.o.   MRN: [id number]  HPI  35 yo M here for 6 1/2 week f/u left calcaneal stress fracture.  Initial OV: Patient reports that about 2 weeks ago he was running in barefoot shoes (noticed he is a heel striker) when he started to develop mostly lateral left heel pain that significantly worsened after his run. Is training for a race on April 28th. Tried to run on a treadmill once after this on the next day but was unable to do so because of sharp pain in this area. + swelling but no bruising. Has been limping since with mild improvement Has been icing and taking some ibuprofen. Pain not worse with any specific ankle motions. He runs about 5 miles a day every day (roughly 35 miles a week) but new to using barefoot shoes. Has not exercised since the day after this started hurting.  Last OV 3 weeks ago: Patient reports he is about 80% improved. Has not done any running or cross training Required cam walker with heel lift until last weekend - now walking without pain without it Stopped icing 2 weeks ago Takes tylenol as needed. Belongs to a gym and can cross train when allowed.  Today: Patient now 6 1/2 weeks out from calcaneal stress fracture. Reports no complaints. Had been cross training a little bit but now back to running this past week Able to run 2 miles at a time each day without recurrence of symptoms, swelling, or bruising. Not requiring any medicines or icing. Wearing supportive shoes now instead of barefoot shoes.  Past Medical History  Diagnosis Date  . Anxiety   . GERD (gastroesophageal reflux disease)     Current Outpatient Prescriptions on File Prior to Visit  Medication Sig Dispense Refill  . ALPRAZolam (XANAX) 0.5 MG tablet 1 by mouth two times a day as needed       . esomeprazole (NEXIUM) 20 MG capsule Take 20 mg by mouth daily.        Marland Kitchen lamoTRIgine (LAMICTAL) 200 MG tablet Take 200 mg by  mouth daily.        . traMADol (ULTRAM) 50 MG tablet Take 1 tablet (50 mg total) by mouth every 6 (six) hours as needed for pain.  60 tablet  1    History reviewed. No pertinent past surgical history.  Allergies  Allergen Reactions  . Erythromycin     History   Social History  . Marital Status: Married    Spouse Name: N/A    Number of Children: N/A  . Years of Education: N/A   Occupational History  . Not on file.   Social History Main Topics  . Smoking status: Never Smoker   . Smokeless tobacco: Never Used  . Alcohol Use: Not on file  . Drug Use: Not on file  . Sexually Active: Not on file   Other Topics Concern  . Not on file   Social History Narrative  . No narrative on file    Family History  Problem Relation Age of Onset  . Diabetes Neg Hx   . Heart attack Neg Hx   . Hypertension Neg Hx     BP 128/78  Pulse 64  Temp(Src) 98.1 F (36.7 C) (Oral)  Ht 5\' 8"  (1.727 m)  Wt 180 lb (81.647 kg)  BMI 27.37 kg/m2  Review of Systems  See HPI above.    Objective:  Physical Exam  Gen: NAD L ankle/foot: No swelling, deformity, bruising, erythema. No TTP lateral calcaneus. no TTP peroneal tendons, plantar fascia, plantar medial insertion of plantar fascia.  No TTP base 5th, navicular, med/lat malleoli, fibular head. FROM ankle. Negative squeeze test. Able to do calf raise and hop without pain.     Assessment & Plan:  1. Calcaneal stress fracture - now completely healed without recurrence of pain.  Discussed increasing activities by 10% each week.  Cross train on off days.  Ice, tylenol as needed.  Wearing more supportive running shoe now.  F/u prn.

## 2010-12-16 ENCOUNTER — Other Ambulatory Visit: Payer: Self-pay | Admitting: Family Medicine

## 2010-12-16 NOTE — Telephone Encounter (Signed)
Refill request

## 2010-12-17 NOTE — Telephone Encounter (Signed)
Phoned in.

## 2010-12-17 NOTE — Telephone Encounter (Signed)
Please phone into pharmacy. Thanks.  

## 2010-12-17 NOTE — Telephone Encounter (Signed)
Refill request for patient of Dr. Deirdre Priest.

## 2011-02-20 ENCOUNTER — Encounter: Payer: Self-pay | Admitting: Family Medicine

## 2011-02-20 ENCOUNTER — Ambulatory Visit (INDEPENDENT_AMBULATORY_CARE_PROVIDER_SITE_OTHER): Payer: BC Managed Care – PPO | Admitting: Family Medicine

## 2011-02-20 VITALS — BP 132/88 | HR 66 | Temp 98.0°F | Ht 69.0 in | Wt 185.0 lb

## 2011-02-20 DIAGNOSIS — J069 Acute upper respiratory infection, unspecified: Secondary | ICD-10-CM

## 2011-02-20 MED ORDER — AZITHROMYCIN 500 MG PO TABS: 500.0000 mg | ORAL_TABLET | Freq: Every day | ORAL | Status: AC

## 2011-02-20 MED ORDER — AZITHROMYCIN 500 MG PO TABS: 500.0000 mg | ORAL_TABLET | Freq: Every day | ORAL | Status: DC

## 2011-02-20 NOTE — Progress Notes (Deleted)
  Subjective:    Patient ID: Dominic Gay, male    DOB: 12-Feb-1976, 34 y.o.   MRN: [id number]  HPI    Review of Systems     Objective:   Physical Exam        Assessment & Plan:

## 2011-02-20 NOTE — Progress Notes (Signed)
  Subjective:     Dominic Gay is a 35 y.o. male who presents for evaluation of symptoms of a URI. Symptoms include congestion and cough described as nonproductive. Onset of symptoms was 3 weeks ago, and has been gradually worsening since that time. Treatment to date: decongestants.  The following portions of the patient's history were reviewed and updated as appropriate: allergies, current medications, past family history, past medical history, past social history, past surgical history and problem list.  Review of Systems Pertinent items are noted in HPI.   Objective:    BP 132/88  Pulse 66  Temp(Src) 98 F (36.7 C) (Oral)  Ht 5\' 9"  (1.753 m)  Wt 185 lb (83.915 kg)  BMI 27.32 kg/m2  General Appearance:    Alert, cooperative, no distress, appears stated age  Head:    Normocephalic, without obvious abnormality, atraumatic  Eyes:    PERRL, conjunctiva/corneas clear, EOM's intact, fundi    benign, both eyes       Ears:    Normal TM's and external ear canals, both ears  Nose:   Appears congested  Throat:   Lips, mucosa, and tongue normal; teeth and gums normal  Neck:   Supple, symmetrical, trachea midline, no adenopathy;       thyroid:  No enlargement/tenderness/nodules; no carotid   bruit or JVD     Lungs:     Clear to auscultation bilaterally, respirations unlabored  Chest wall:    No tenderness or deformity                       Lymph nodes:   Cervical, supraclavicular, and axillary nodes normal        Assessment:    viral upper respiratory illness  possibly atypical bacterial infection  Plan:    Discussed diagnosis and treatment of URI. Suggested symptomatic OTC remedies. Nasal saline spray for congestion. Zithromax per orders. Follow up as needed.

## 2011-02-20 NOTE — Progress Notes (Signed)
Addended by: Edd Arbour on: 02/20/2011 03:31 PM   Modules accepted: Orders

## 2011-02-20 NOTE — Patient Instructions (Signed)
Drink plenty of fluids, and get adequate rest. If your symptoms persist after antibiotic course, please come back and see me.

## 2011-03-10 LAB — POCT RAPID STREP A: Streptococcus, Group A Screen (Direct): NEGATIVE

## 2011-06-11 ENCOUNTER — Ambulatory Visit: Payer: BC Managed Care – PPO | Admitting: Family Medicine

## 2011-06-17 HISTORY — PX: MENISCUS REPAIR: SHX5179

## 2011-07-17 ENCOUNTER — Other Ambulatory Visit: Payer: Self-pay | Admitting: Family Medicine

## 2011-07-17 NOTE — Telephone Encounter (Signed)
Refill request

## 2011-08-19 ENCOUNTER — Other Ambulatory Visit: Payer: Self-pay | Admitting: Family Medicine

## 2011-08-19 NOTE — Telephone Encounter (Signed)
Refill request

## 2011-08-19 NOTE — Telephone Encounter (Signed)
Please call in refill I signed Thanks LC

## 2011-09-01 ENCOUNTER — Ambulatory Visit (INDEPENDENT_AMBULATORY_CARE_PROVIDER_SITE_OTHER): Payer: BC Managed Care – PPO | Admitting: Family Medicine

## 2011-09-01 ENCOUNTER — Encounter: Payer: Self-pay | Admitting: Family Medicine

## 2011-09-01 VITALS — BP 120/72 | HR 58 | Temp 98.5°F | Ht 69.0 in | Wt 172.0 lb

## 2011-09-01 DIAGNOSIS — F411 Generalized anxiety disorder: Secondary | ICD-10-CM

## 2011-09-01 DIAGNOSIS — F3189 Other bipolar disorder: Secondary | ICD-10-CM

## 2011-09-01 NOTE — Patient Instructions (Signed)
Call if any problems  Come in for a physical every 2-3 years as needed

## 2011-09-01 NOTE — Progress Notes (Signed)
  Subjective:    Patient ID: FLAY GHOSH, male    DOB: 04/24/1976, 36 y.o.   MRN: [id number]  HPI  CPE  He feels well and has no symptoms  He is exercising regularly, does not smoke    Review of Systems Patient reports no  vision/ hearing changes,anorexia, weight change, fever ,adenopathy, persistant / recurrent hoarseness, swallowing issues, chest pain, edema,persistant / recurrent cough, hemoptysis, dyspnea(rest, exertional, paroxysmal nocturnal), gastrointestinal  bleeding (melena, rectal bleeding), abdominal pain, excessive heart burn, GU symptoms(dysuria, hematuria, pyuria, voiding/incontinence  Issues) syncope, focal weakness, severe memory loss, concerning skin lesions, depression, anxiety, abnormal bruising/bleeding, major joint swelling except from his recent meniscus repair    Objective:   Physical Exam  Ears:  External ear exam shows no significant lesions or deformities.  Otoscopic examination reveals clear canals, tympanic membranes are intact bilaterally without bulging, retraction, inflammation or discharge. Hearing is grossly normal bilaterall Eye - Pupils Equal Round Reactive to light, Extraocular movements intact, Fundi without hemorrhage or visible lesions, Conjunctiva without redness or discharge Neck:  No deformities, thyromegaly, masses, or tenderness noted.   Supple with full range of motion without pain. Heart - Regular rate and rhythm.  No murmurs, gallops or rubs.    Lungs:  Normal respiratory effort, chest expands symmetrically. Lungs are clear to auscultation, no crackles or wheezes. Abdomen: soft and non-tender without masses, organomegaly or hernias noted.  No guarding or rebound Extremities:  No cyanosis, edema, or deformity noted with good range of motion of all major joints.         Assessment & Plan:

## 2011-09-01 NOTE — Assessment & Plan Note (Signed)
Stable on current dose of lamictal.  Is doing well

## 2011-09-01 NOTE — Assessment & Plan Note (Signed)
Well controlled Uses xanax less than weekly on as needed basis.

## 2011-10-15 ENCOUNTER — Other Ambulatory Visit: Payer: Self-pay | Admitting: Family Medicine

## 2011-10-22 ENCOUNTER — Ambulatory Visit (INDEPENDENT_AMBULATORY_CARE_PROVIDER_SITE_OTHER): Payer: BC Managed Care – PPO | Admitting: Family Medicine

## 2011-10-22 VITALS — BP 131/83 | HR 76 | Temp 100.3°F | Ht 68.0 in | Wt 175.4 lb

## 2011-10-22 DIAGNOSIS — J029 Acute pharyngitis, unspecified: Secondary | ICD-10-CM | POA: Insufficient documentation

## 2011-10-22 MED ORDER — PENICILLIN V POTASSIUM 500 MG PO TABS: 500.0000 mg | ORAL_TABLET | Freq: Four times a day (QID) | ORAL | Status: AC

## 2011-10-23 ENCOUNTER — Encounter: Payer: Self-pay | Admitting: Family Medicine

## 2011-10-23 NOTE — Progress Notes (Signed)
  Subjective:    Patient ID: Dominic Gay, male    DOB: 01-Sep-1975, 36 y.o.   MRN: [id number]  HPI Patient with two days of severe sore throat.  No sick contacts.  No rhinorrhea of cough.  Positive subjective fever and body aches.  Monogamous.  Not immunocompromised    Review of Systems     Objective:   Physical ExamTMs OK Throat exudative pharyngitis without peritonsilar abscess Minor high ant cervical triangle lymph nodes. Lungs clear        Assessment & Plan:

## 2011-10-23 NOTE — Patient Instructions (Signed)
I am going to treat you as a strep throat even though your strep test was negative. If your symptoms persist, I will need to do a blood test for Mono. If it is not strep it is likely viral and should get better on its own. See [place] again if you are not showing real improvement in 5 days.

## 2011-10-23 NOTE — Assessment & Plan Note (Signed)
Exudative pharyngitis but strep neg.  Will Rx as strep.  May need monospot if symptoms persist.

## 2011-10-29 ENCOUNTER — Encounter: Payer: Self-pay | Admitting: Family Medicine

## 2011-10-29 ENCOUNTER — Ambulatory Visit: Payer: BC Managed Care – PPO | Admitting: Family Medicine

## 2011-10-29 ENCOUNTER — Ambulatory Visit (INDEPENDENT_AMBULATORY_CARE_PROVIDER_SITE_OTHER): Payer: BC Managed Care – PPO | Admitting: Family Medicine

## 2011-10-29 VITALS — BP 126/82 | HR 64 | Temp 98.4°F | Ht 69.0 in | Wt 170.0 lb

## 2011-10-29 DIAGNOSIS — M25562 Pain in left knee: Secondary | ICD-10-CM

## 2011-10-29 DIAGNOSIS — M25569 Pain in unspecified knee: Secondary | ICD-10-CM

## 2011-10-29 NOTE — Patient Instructions (Signed)
Your pain is likely due to either synovitis (inflammation of the lining of the joint), post-surgical inflammation, or the Grade 3 articular cartilage defect found on your arthroscopy. Continue with tylenol, ibuprofen, icing, and tramadol. Cortisone injection given today should help all of these conditions though can take a few days (up to a week) for the cortisone to kick in. If you're still not improving can consider repeating your MRI though this would likely not show a new meniscal tear, loose body, or ligament injury.

## 2011-10-30 ENCOUNTER — Encounter: Payer: Self-pay | Admitting: Family Medicine

## 2011-10-30 DIAGNOSIS — M25562 Pain in left knee: Secondary | ICD-10-CM | POA: Insufficient documentation

## 2011-10-30 NOTE — Progress Notes (Addendum)
Subjective:    Patient ID: Dominic Gay, male    DOB: 04/17/1976, 36 y.o.   MRN: [id number]  PCP: Dr. Deirdre Priest  HPI 36 yo M here for left knee pain.  Patient reports having had problems with left knee toward end of 2012. Was found by MRI to have a partially torn medial meniscus, had meniscectomy in January 2013. Overall completely improved from this until about 2 weeks ago. Pain started recurring in same location medially. No swelling, bruising. Has been icing, taking tylenol, ibuprofen, and tramadol. Uses elliptical and running some but only about 2-3 miles at a time. No longer barefoot running. No new trauma or injury. No locking or giving out.  Feels like it does catch occasionally. 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  Past Medical History  Diagnosis Date  . Anxiety   . GERD (gastroesophageal reflux disease)     Current Outpatient Prescriptions on File Prior to Visit  Medication Sig Dispense Refill  . ALPRAZolam (XANAX) 0.5 MG tablet TAKE 1 TABLET BY MOUTH TWICE A DAY AS NEEDED  60 tablet  0  . lamoTRIgine (LAMICTAL) 200 MG tablet TAKE 1 TABLET BY MOUTH DAILY  90 tablet  3  . omeprazole (PRILOSEC) 10 MG capsule Take 10 mg by mouth as needed.      . penicillin v potassium (VEETID) 500 MG tablet Take 1 tablet (500 mg total) by mouth 4 (four) times daily.  40 tablet  0    Past Surgical History  Procedure Date  . Meniscus repair 2013    Allergies  Allergen Reactions  . Erythromycin     History   Social History  . Marital Status: Married    Spouse Name: N/A    Number of Children: N/A  . Years of Education: N/A   Occupational History  . Not on file.   Social History Main Topics  . Smoking status: Never Smoker   . Smokeless tobacco: Never Used  . Alcohol Use: Not on file  . Drug Use: No  . Sexually Active: Yes -- Male partner(s)    Other Topics Concern  . Not on file   Social History Narrative   Works as Associate Professor at [employer].  Two children 2003 and 2005.  Likes to run and hike    Family History  Problem Relation Age of Onset  . Diabetes Neg Hx   . Heart attack Neg Hx   . Hypertension Neg Hx     BP 126/82  Pulse 64  Temp(Src) 98.4 F (36.9 C) (Oral)  Ht 5\' 9"  (1.753 m)  Wt 170 lb (77.111 kg)  BMI 25.10 kg/m2  Review of Systems See HPI above.    Objective:   Physical Exam Gen: NAD  L knee: No gross deformity, ecchymoses, swelling.  Well healed arthroscopy scars. TTP medial joint line more at femoral condyle. No post patellar facet, lateral joint line, other TTP about knee. FROM. Negative ant/post drawers. Negative valgus/varus testing. Negative lachmanns. Negative mcmurrays, apleys - some pain medial joint line.  Negative patellar apprehension, clarkes. NV intact distally.  R knee: FROM without pain, instability.    Assessment & Plan:  1. Left knee pain - s/p partial medial meniscectomy with medial joint line pain now.  Very unlikely he has a new meniscal tear (no new injury).  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  Advised we move forward with cortisone injection which would help with both conditions.  Can continue icing, tylenol, ibuprofen, tramadol as needed.  Activities as tolerated.  After informed written consent, patient was seated on exam table. Left knee was prepped with alcohol swab and utilizing anteromedial approach, patient's left knee was injected intraarticularly with 3:1 marcaine: depomedrol. Patient tolerated the procedure well without immediate complications.  Addendum 5/29:  Patient called stating his pain medial aspect left knee has persisted despite post-operative physical therapy, home exercise program, cortisone injection, relative rest, icing, ibuprofen, tramadol.   Discussed options: MRI, PT - as he's completed a course of PT will move ahead with repeat MRI to assess for new medial meniscal tear.  Addendum 6/3:  Reviewed MRI and discussed with patient.  There is evidence of a small medial meniscus tear with a flap, consistent with the location of his current pain that's not improving with conservative care.  Will print copy of MRI report and have MRI put on a CD for patient to pick up and take to his surgeon (had surgery done by an orthopedic at [facility]).

## 2011-10-30 NOTE — Assessment & Plan Note (Signed)
s/p partial medial meniscectomy with medial joint line pain now.  Very unlikely he has a new meniscal tear (no new injury).  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  Advised we move forward with cortisone injection which would help with both conditions.  Can continue icing, tylenol, ibuprofen, tramadol as needed.  Activities as tolerated.  After informed written consent, patient was seated on exam table. Left knee was prepped with alcohol swab and utilizing anteromedial approach, patient's left knee was injected intraarticularly with 3:1 marcaine: depomedrol. Patient tolerated the procedure well without immediate complications.

## 2011-11-04 MED ORDER — TRAMADOL HCL 50 MG PO TABS: 50.0000 mg | ORAL_TABLET | Freq: Three times a day (TID) | ORAL | Status: DC | PRN

## 2011-11-04 NOTE — Progress Notes (Signed)
Addended by: Lenda Kelp on: 11/04/2011 01:40 PM   Modules accepted: Orders

## 2011-11-12 NOTE — Progress Notes (Signed)
Addended by: Lenda Kelp on: 11/12/2011 01:56 PM   Modules accepted: Orders

## 2011-11-15 ENCOUNTER — Ambulatory Visit (HOSPITAL_BASED_OUTPATIENT_CLINIC_OR_DEPARTMENT_OTHER)
Admission: RE | Admit: 2011-11-15 | Discharge: 2011-11-15 | Disposition: A | Discharge status: Home / Self Care | Payer: BC Managed Care – PPO | Source: Ambulatory Visit | Attending: Family Medicine | Admitting: Family Medicine

## 2011-11-15 DIAGNOSIS — M23305 Other meniscus derangements, unspecified medial meniscus, unspecified knee: Secondary | ICD-10-CM | POA: Insufficient documentation

## 2011-11-15 DIAGNOSIS — Z9889 Other specified postprocedural states: Secondary | ICD-10-CM | POA: Insufficient documentation

## 2011-11-15 DIAGNOSIS — M25569 Pain in unspecified knee: Secondary | ICD-10-CM | POA: Insufficient documentation

## 2011-11-15 DIAGNOSIS — M25562 Pain in left knee: Secondary | ICD-10-CM

## 2011-12-02 ENCOUNTER — Other Ambulatory Visit: Payer: Self-pay | Admitting: Family Medicine

## 2011-12-02 NOTE — Telephone Encounter (Signed)
Please phone in Rx Thanks LC

## 2011-12-02 NOTE — Telephone Encounter (Signed)
Refill called in. Dominic Gay, [name]

## 2012-05-27 ENCOUNTER — Ambulatory Visit (INDEPENDENT_AMBULATORY_CARE_PROVIDER_SITE_OTHER): Payer: BC Managed Care – PPO | Admitting: Family Medicine

## 2012-05-27 ENCOUNTER — Encounter: Payer: Self-pay | Admitting: Family Medicine

## 2012-05-27 VITALS — BP 133/74 | HR 78 | Temp 98.3°F | Ht 69.0 in | Wt 179.0 lb

## 2012-05-27 DIAGNOSIS — R042 Hemoptysis: Secondary | ICD-10-CM | POA: Insufficient documentation

## 2012-05-27 DIAGNOSIS — J069 Acute upper respiratory infection, unspecified: Secondary | ICD-10-CM

## 2012-05-27 MED ORDER — BENZONATATE 100 MG PO CAPS: 100.0000 mg | ORAL_CAPSULE | Freq: Two times a day (BID) | ORAL | Status: DC | PRN

## 2012-05-27 NOTE — Progress Notes (Signed)
  Subjective:    Patient ID: Dominic Gay, male    DOB: April 24, 1976, 36 y.o.   MRN: [id number]  HPI  Dominic Gay comes in with 5 days of cough, nasal congestion, sore throat, and headache.  He says he had a low grade fever earlier this week.  He says this morning he coughed up blood.  He denies any shortness of breath, chronic cough, history of smoking, night sweats, weight loss.   Review of Systems Pertinent itemes in HPI    Objective:   Physical Exam BP 133/74  Pulse 78  Temp 98.3 F (36.8 C) (Oral)  Ht 5\' 9"  (1.753 m)  Wt 179 lb (81.194 kg)  BMI 26.43 kg/m2 General appearance: alert, cooperative and no distress, non-toxic Head: sinuses nontender to percussion Ears: normal TM's and external ear canals both ears Nose: clear discharge, turbinates red Throat: there is some mild erythema of throat and tonsils, no exudates or swelling Neck: no adenopathy and supple, symmetrical, trachea midline Lungs: clear to auscultation bilaterally Heart: regular rate and rhythm, S1, S2 normal, no murmur, click, rub or gallop Pulses: 2+ and symmetric        Assessment & Plan:

## 2012-05-27 NOTE — Assessment & Plan Note (Signed)
Exam reassuring, Rx for tessalon for symptom control.  Discussed supportive care.

## 2012-05-27 NOTE — Patient Instructions (Signed)
Please stop by the front desk to make a nurse visit to have a PPD placed tomorrow, that way it can be checked on Monday.    Please be sure to drink lots of fluids and get plenty of rest.  Also, try the tessalon for your cough.    If you develop high fevers, wheezing, shortness of breath, or any other concerning symptoms, please call the office.

## 2012-05-27 NOTE — Assessment & Plan Note (Signed)
New today, suspect this is from irritation of throat, do not have strong suspicion for TB.  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  Will have a ppd placed to be sure.

## 2012-05-28 ENCOUNTER — Ambulatory Visit (INDEPENDENT_AMBULATORY_CARE_PROVIDER_SITE_OTHER): Payer: BC Managed Care – PPO | Admitting: *Deleted

## 2012-05-28 DIAGNOSIS — Z111 Encounter for screening for respiratory tuberculosis: Secondary | ICD-10-CM

## 2012-05-31 ENCOUNTER — Ambulatory Visit (INDEPENDENT_AMBULATORY_CARE_PROVIDER_SITE_OTHER): Payer: BC Managed Care – PPO | Admitting: *Deleted

## 2012-05-31 DIAGNOSIS — Z111 Encounter for screening for respiratory tuberculosis: Secondary | ICD-10-CM

## 2012-10-20 ENCOUNTER — Other Ambulatory Visit: Payer: Self-pay | Admitting: Family Medicine

## 2012-11-09 ENCOUNTER — Telehealth: Payer: Self-pay | Admitting: Family Medicine

## 2012-11-09 ENCOUNTER — Ambulatory Visit (INDEPENDENT_AMBULATORY_CARE_PROVIDER_SITE_OTHER): Payer: BC Managed Care – PPO | Admitting: Family Medicine

## 2012-11-09 ENCOUNTER — Encounter: Payer: Self-pay | Admitting: Family Medicine

## 2012-11-09 VITALS — BP 142/94 | HR 87 | Ht 68.0 in | Wt 183.0 lb

## 2012-11-09 DIAGNOSIS — R519 Headache, unspecified: Secondary | ICD-10-CM | POA: Insufficient documentation

## 2012-11-09 DIAGNOSIS — R51 Headache: Secondary | ICD-10-CM | POA: Insufficient documentation

## 2012-11-09 DIAGNOSIS — G4485 Primary stabbing headache: Secondary | ICD-10-CM

## 2012-11-09 MED ORDER — INDOMETHACIN 50 MG PO CAPS: 50.0000 mg | ORAL_CAPSULE | Freq: Two times a day (BID) | ORAL | Status: DC

## 2012-11-09 NOTE — Patient Instructions (Addendum)
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 Take indomethacin now and again this evening.  If the pain is not better by Thursday, come back and we will do a lidocaine injection.

## 2012-11-09 NOTE — Telephone Encounter (Signed)
Pt reports that he was awakened out of sleep with pain above right ear - denies headache, vision changes or other symptoms. Has taken motrin with no relief. Pt would like to be seen today. Appointment made for CC for 945 - pt is en route. Wyatt Haste, RN-BSN

## 2012-11-09 NOTE — Progress Notes (Signed)
Patient ID: Dominic Gay, male   DOB: 1976-04-12, 37 y.o.   MRN: [id number] Subjective: The patient is a 37 y.o. year old male who presents today for and pain.  Patient was awakened this morning by a stabbing pain in the left posterior region of his head. The area of stabbing pain is approximately the size of nickel. Pain continues at this point in time and continues to be stabbing in quality. Patient is not taking any medication for this. Patient has not had significant problems with headaches in the past.  Patient's past medical, social, and family history were reviewed and updated as appropriate. History  Substance Use Topics  . Smoking status: Never Smoker   . Smokeless tobacco: Never Used  . Alcohol Use: Not on file   Objective:  Filed Vitals:   11/09/12 1026  BP: 142/94  Pulse: 87   Gen: No acute distress HEENT: Mucous members moist, extraocular was intact, pupils equal round reactive to light, there is no discernible abnormality of the skin of the head Neuro: Normal coordination, normal gait  Assessment/Plan:  Please also see individual problems in problem list for problem-specific plans.

## 2012-11-09 NOTE — Telephone Encounter (Signed)
Pt has pain the size of a nickel just above his ear. It is a sharp continous pain that wont go away. He has no headache or any other symptons Please advise

## 2012-11-09 NOTE — Assessment & Plan Note (Signed)
Discuss treatment options with the patient. Given the lack of neurologic signs no imaging is indicated at this time. We will try indomethacin x2 doses. If patient's headache is not better in 2 days to return for a lidocaine injection. At that time, if his headache is worsening, we might consider neuro imaging

## 2012-11-10 ENCOUNTER — Telehealth: Payer: Self-pay | Admitting: Family Medicine

## 2012-11-10 NOTE — Telephone Encounter (Signed)
Pt was here yesterday and now headache is going into a full blown migraine - is asking for [place] to send in script for imitrex or something to help knock the migraine out  [facility]

## 2012-11-10 NOTE — Telephone Encounter (Signed)
Left message on identifiable voice mail with instructions to seek further evaluation at the ED with red flag symptoms pertaining to headache. Forwarded message to Dr. Louanne Belton. Wyatt Haste, RN-BSN

## 2012-11-10 NOTE — Telephone Encounter (Signed)
Pt wishes to have something called in for migraine. Seen here yesterday for it and no relief from prescribed indocin or OTC pain relief. Please advise.Wyatt Haste, RN-BSN

## 2012-11-10 NOTE — Telephone Encounter (Signed)
Could you please send message to the provider that saw patient yesterday?

## 2012-11-10 NOTE — Telephone Encounter (Signed)
Could you please send message to provider who saw patient yesterday,also if having very bad headache to follow up or go to the ER.Discuss red flag signs.

## 2012-11-11 MED ORDER — SUMATRIPTAN SUCCINATE 50 MG PO TABS: 50.0000 mg | ORAL_TABLET | ORAL | Status: DC | PRN

## 2012-11-11 NOTE — Telephone Encounter (Signed)
Called pt and informed him that meds had been called in - stated that headache was ongoing. Instructed him to try new med and if no relief to call back for appointment for injections. Wyatt Haste, RN-BSN

## 2012-11-11 NOTE — Telephone Encounter (Signed)
Sent in.  If this doesn't work, would need to come in for [place] to re-examine him and could consider doing some injections to try to help.

## 2012-11-12 ENCOUNTER — Telehealth: Payer: Self-pay | Admitting: Family Medicine

## 2012-11-12 ENCOUNTER — Other Ambulatory Visit: Payer: Self-pay | Admitting: Family Medicine

## 2012-11-12 ENCOUNTER — Ambulatory Visit (INDEPENDENT_AMBULATORY_CARE_PROVIDER_SITE_OTHER): Payer: BC Managed Care – PPO | Admitting: Family Medicine

## 2012-11-12 ENCOUNTER — Other Ambulatory Visit: Payer: Self-pay | Admitting: *Deleted

## 2012-11-12 ENCOUNTER — Ambulatory Visit: Payer: BC Managed Care – PPO

## 2012-11-12 VITALS — BP 143/83 | HR 74 | Temp 98.1°F | Wt 184.0 lb

## 2012-11-12 DIAGNOSIS — R51 Headache: Secondary | ICD-10-CM

## 2012-11-12 MED ORDER — ALPRAZOLAM 0.5 MG PO TABS: 0.5000 mg | ORAL_TABLET | Freq: Two times a day (BID) | ORAL | Status: DC | PRN

## 2012-11-12 NOTE — Patient Instructions (Addendum)
We will go ahead and schedule MRI   Try Ativan to see if it helps your headaches  Follow-up and make appointment for Tuesday

## 2012-11-12 NOTE — Telephone Encounter (Signed)
Pt has ads continuous migraine with no relief from indocin or imitrex tablets . Seen by [facility] on Tues and dx with pinpoint migraine. No relief by Clovis Cao and Imitrex po called in - pt reports no relief only nausea. Discussed with Dr. Lum Babe - requested that pt come in for further eval - pt contacted and agreed to come in this afternoon at 230 - aware that he could possibly be sent to ED for further eval after office visit. Pt verbalized understanding. Wyatt Haste, RN-BSN

## 2012-11-12 NOTE — Assessment & Plan Note (Signed)
Previously thought to have primary stabbing headaches, however, his headaches are not worse and not only right temporal but now wraps around back of his head and is left sided as well. 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  Indomethacin, ibuprofen, tramadol, Imitrex not helping.  No focal neurological deficits today, but worsening of headaches, no history of previous similar headache, and word finding difficulties concerning.  -Schedule MRI  -Try Ativan in case of muscle spasm -Follow-up next week

## 2012-11-12 NOTE — Progress Notes (Signed)
  Subjective:    Patient ID: Dominic Gay, male    DOB: [birth year], 37 y.o.   MRN: [id number]  HPI # Persistent headache  He was seen in clinic 05/27 for the same issue. He had woken up 05/26 with an intense pain on the right temporal area of his head.   Since his last visit, the pain is now diffuse.  He feels like he is drunk and his head is swimming.   Denies hearing or vision changes  His employees have noted that he has been having word-finding difficulties   Quality: constant stabbing pain from the back right side of his head and wrapping around the left side of his head (right side of his head is spared)  Meds: -Imitrex: did not help headaches; made him feel like he was going to vomit -Indomethacin: did not help  -Ibuprofen: does not help   Of note, he took a course of CLK used for muscle toning that he gets at [facility]. He takes every 6 months. No problems with it in the past.   Review of Systems Per HPI  Denies chest pain, dyspnea, fevers, chills, vomiting, changes in strength/sensation/gait, new stressors, exposure to new environment (e.g., wooded areas)  Allergies, medication, past medical history reviewed.  Smoking status noted. He has had migraines in the past but never so severe, lasting this long (previously lasting hours) No history of seizures  Family history:  No history of neurological disorders, stroke, migraines  Sexually active with wife only      Objective:   Physical Exam GEN: appears uncomfortable HEENT:   Head: [place]/AT   Eyes: normal conjunctiva without injection or tearing   Ears: TM clear bilaterally with good light reflex and without erythema or air-fluid level   Nose: no rhinorrhea, normal turbinates   Mouth: MMM; no tonsillar adenopathy; no oropharyngeal erythema NECK: no LAD; mild tenderness along left trapezius; ROM good NEURO:    Alert and oriented x 3, normal speech, normal thought process   EOMI, no nystagmus, PERRLA   Could not  visualize optics discs   CN intact   Sensation: intact throughout   Strength: 5/5 upper and lower extremities    Finger to nose, heel to shin: intact   Romberg: negative   Reflexes: 2+ brachial, brachioradialis, patellar; no clonus CV: RRR PULM: NI WOB; CTAB without w/r/r GAIT: normal, non-antalgic     Assessment & Plan:

## 2012-11-12 NOTE — Telephone Encounter (Signed)
Patient was seen on Tuesday for a Migraine that is continuing and he really would like for and needs for it to go away and wants to know what the next step will be.

## 2012-11-12 NOTE — Telephone Encounter (Signed)
Requested Prescriptions   Pending Prescriptions Disp Refills  . ALPRAZolam (XANAX) 0.5 MG tablet 60 tablet 2

## 2012-11-13 ENCOUNTER — Ambulatory Visit (HOSPITAL_COMMUNITY)
Admission: RE | Admit: 2012-11-13 | Discharge: 2012-11-13 | Disposition: A | Discharge status: Home / Self Care | Payer: BC Managed Care – PPO | Source: Ambulatory Visit | Attending: Family Medicine | Admitting: Family Medicine

## 2012-11-13 ENCOUNTER — Telehealth: Payer: Self-pay | Admitting: Family Medicine

## 2012-11-13 DIAGNOSIS — J3489 Other specified disorders of nose and nasal sinuses: Secondary | ICD-10-CM | POA: Insufficient documentation

## 2012-11-13 DIAGNOSIS — R51 Headache: Secondary | ICD-10-CM

## 2012-11-13 DIAGNOSIS — G43909 Migraine, unspecified, not intractable, without status migrainosus: Secondary | ICD-10-CM | POA: Insufficient documentation

## 2012-11-13 NOTE — Telephone Encounter (Signed)
No answer  LVM notified negative brain MRI but some signs of sinus disease May be reasonable to treat sinusitis to see if it helps?  If patient interested, advised he call clinic let me know Otherwise, follow-up next week with me or Dr. Louanne Belton whom he saw previously for this issue; advised he call Monday to make appointment

## 2012-11-15 ENCOUNTER — Telehealth: Payer: Self-pay | Admitting: Family Medicine

## 2012-11-15 MED ORDER — AMOXICILLIN-POT CLAVULANATE 875-125 MG PO TABS: 1.0000 | ORAL_TABLET | Freq: Two times a day (BID) | ORAL | Status: DC

## 2012-11-15 MED ORDER — INDOMETHACIN ER 75 MG PO CPCR: ORAL_CAPSULE | ORAL | Status: DC

## 2012-11-15 NOTE — Telephone Encounter (Signed)
Pt called again asking for antibotic

## 2012-11-15 NOTE — Telephone Encounter (Signed)
Pt told Dr. Madolyn Frieze not in clinic today, but I would give her the message that patient would like an antibiotic.  Told pt he has appt tomorrow at 2 pm with Dr. Madolyn Frieze and if she doesn't get the message before then, that he could discuss with her at the appt.  Pt. Verbalized understanding.  Kayron Kalmar, Darlyne Russian, CMA

## 2012-11-15 NOTE — Telephone Encounter (Signed)
Headache unchanges, still significant No new symptoms, not worsening  Rx Augmentin sent for 7 days for sinusitis  Also try Indomethacin up to 150 mg a day in case of Dx of stabbing headache, cluster headache  Follow-up on Thursday (cancel tomorrow); if not improved, refer headache clinic

## 2012-11-15 NOTE — Telephone Encounter (Signed)
Pt states dr oh park left him a message to call in today to get an antibotic. Phone message documentation states she said to make an appt. He has appt tomorrow Please advise

## 2012-11-16 ENCOUNTER — Telehealth: Payer: Self-pay | Admitting: *Deleted

## 2012-11-16 ENCOUNTER — Ambulatory Visit: Payer: BC Managed Care – PPO | Admitting: Family Medicine

## 2012-11-16 MED ORDER — ALPRAZOLAM 0.5 MG PO TABS: 0.5000 mg | ORAL_TABLET | Freq: Two times a day (BID) | ORAL | Status: DC | PRN

## 2012-11-16 NOTE — Telephone Encounter (Signed)
Please print and sign refill for xanax and leave at front desk for pick up  - states it was printed but it is not up front for patient pick up. Wyatt Haste, RN-BSN

## 2012-11-16 NOTE — Telephone Encounter (Signed)
Pt called and informed that Xanax would not be refilled until next appointment. Pt verbalized understanding. Wyatt Haste, RN-BSN

## 2012-11-16 NOTE — Telephone Encounter (Signed)
I gave him 15 of Xanax last week; Rx was printed and given to patient.  Rx for 5 more tablets will be given; no more until his next appt.

## 2012-11-17 ENCOUNTER — Telehealth: Payer: Self-pay | Admitting: Family Medicine

## 2012-11-17 NOTE — Telephone Encounter (Signed)
Pt scheduled appt for 11-24-12.  He stated dr Bluford Kaufmann park prescribed an antibotic cream and wanted him to come in for an appt tomorrow. She does not have any available appts for tomorrow.  Please advise

## 2012-11-17 NOTE — Telephone Encounter (Signed)
Dr. Madolyn Frieze has available appts for Friday.  Pt will schedule follow up for that day.  Lukisha Procida, Darlyne Russian, CMA

## 2012-11-19 ENCOUNTER — Ambulatory Visit (INDEPENDENT_AMBULATORY_CARE_PROVIDER_SITE_OTHER): Payer: BC Managed Care – PPO | Admitting: Family Medicine

## 2012-11-19 VITALS — BP 121/77 | HR 73 | Temp 98.2°F | Wt 184.0 lb

## 2012-11-19 DIAGNOSIS — R51 Headache: Secondary | ICD-10-CM

## 2012-11-19 NOTE — Progress Notes (Signed)
  Subjective:    Patient ID: Dominic Gay, male    DOB: December 05, 1975, 37 y.o.   MRN: [id number]  HPI # Follow-up headache Started Augmentin and Indomethacin on 06/02. He does not think the latter is helping; he takes Indomethacin in the morning.   The headache started to improve on 06/04.  The headache is worst when he wakes up but improves as the day progresses so that by evening time it is almost resolved.   Review of Systems Denies fevers, chills, diarrhea; endorses very mild headache right temporal at this time   Allergies, medication, past medical history reviewed.  Smoking status noted.     Objective:   Physical Exam GEN: NAD; well-appearing NEURO: moves all extremities well; normal speech and affect; normal gait     Assessment & Plan:

## 2012-11-19 NOTE — Assessment & Plan Note (Signed)
Improved. He started Augmentin 06/02 and headache started improving 06/04.  -Defer neurology appointment at this time; advised if headache returns, try indomethacin; if not improved, then advised come in or request neurology referral -Stop indomethacin; take prn per above  -Follow-up prn

## 2012-11-19 NOTE — Patient Instructions (Addendum)
Finish Augmentin  You may stop indomethacin and see how you do  If the headache comes back, try the indomethacin, and if this helps, let just me know   If your headache comes back and indomethacin does not help, you may come back to clinic or ask for a neurology referral

## 2012-11-24 ENCOUNTER — Ambulatory Visit: Payer: BC Managed Care – PPO | Admitting: Family Medicine

## 2012-11-26 ENCOUNTER — Telehealth: Payer: Self-pay | Admitting: *Deleted

## 2012-11-26 MED ORDER — ALPRAZOLAM 0.5 MG PO TABS: 0.5000 mg | ORAL_TABLET | Freq: Two times a day (BID) | ORAL | Status: DC | PRN

## 2012-11-26 NOTE — Telephone Encounter (Signed)
Done.  Zinnia Tindall L, CMA  

## 2012-11-26 NOTE — Telephone Encounter (Signed)
Please call in the rx for 30  Thanks  LC

## 2012-11-26 NOTE — Telephone Encounter (Signed)
Will fwd to MD.  See previous notes.  Dominic Gay, Darlyne Russian, CMA

## 2012-11-26 NOTE — Telephone Encounter (Signed)
Patient will be in to see Dr. Deirdre Priest on 6/23, he has been in and seen other doctors, the last note from Dr. Deirdre Priest was to Call if any problems and Come in for a physical every 2-3 years as needed.  He would appreciate some Xanax to last until his appt on 6/23, he doesn't use the Xanax very often in fact his refills expired before he used all of the refills so he just wants to be sure to have some on hand.

## 2012-12-06 ENCOUNTER — Ambulatory Visit (INDEPENDENT_AMBULATORY_CARE_PROVIDER_SITE_OTHER): Payer: BC Managed Care – PPO | Admitting: Family Medicine

## 2012-12-06 ENCOUNTER — Encounter: Payer: Self-pay | Admitting: Family Medicine

## 2012-12-06 VITALS — BP 115/77 | HR 71 | Temp 98.2°F | Ht 68.0 in | Wt 183.0 lb

## 2012-12-06 DIAGNOSIS — R51 Headache: Secondary | ICD-10-CM

## 2012-12-06 DIAGNOSIS — F411 Generalized anxiety disorder: Secondary | ICD-10-CM

## 2012-12-06 DIAGNOSIS — F3189 Other bipolar disorder: Secondary | ICD-10-CM

## 2012-12-06 MED ORDER — ALPRAZOLAM 0.5 MG PO TABS: 0.5000 mg | ORAL_TABLET | Freq: Two times a day (BID) | ORAL | Status: DC | PRN

## 2012-12-06 NOTE — Patient Instructions (Addendum)
  Every thing looks healthy  Consider a fasting cholesterol in the next year -

## 2012-12-07 NOTE — Assessment & Plan Note (Signed)
Stable  Continue as needed xanax which he uses appropriately

## 2012-12-07 NOTE — Assessment & Plan Note (Signed)
Resovled.

## 2012-12-07 NOTE — Assessment & Plan Note (Signed)
Well controlled Continue lamictal 

## 2012-12-07 NOTE — Progress Notes (Signed)
  Subjective:    Patient ID: Dominic Gay, male    DOB: 09-30-1975, 37 y.o.   MRN: [id number]  HPI  Anxiety Stable.  Uses xanax several per week to month.  No issues with anxiety impeding his function.  No depressive thougthts.  Bipolar - well controlled with lamical.  Doing well at home and with work.   No rashes, weight changes.  Headache - completely resolved.  No pain or vision changes or balance issues.  Taking no medication for it  Review of Symptoms - see HPI  PMH - Smoking status noted.      Review of Systems     Objective:   Physical Exam Alert no acute distress Heart - Regular rate and rhythm.  No murmurs, gallops or rubs.    Lungs:  Normal respiratory effort, chest expands symmetrically. Lungs are clear to auscultation, no crackles or wheezes.        Assessment & Plan:

## 2013-01-18 ENCOUNTER — Other Ambulatory Visit: Payer: Self-pay | Admitting: Family Medicine

## 2013-03-11 ENCOUNTER — Telehealth: Payer: Self-pay | Admitting: Family Medicine

## 2013-03-11 NOTE — Telephone Encounter (Signed)
Would like referral to pain clinic Please advise

## 2013-03-14 NOTE — Telephone Encounter (Signed)
He would need an appointment with me to discuss reasons etc as I have not seen him for pain recenlty Thanks LC

## 2013-03-14 NOTE — Telephone Encounter (Signed)
Pt is agreeable, but has appt with [place] for tomorrow. Will wait to see what he says. Lashane Whelpley, [name]

## 2013-03-15 ENCOUNTER — Ambulatory Visit (INDEPENDENT_AMBULATORY_CARE_PROVIDER_SITE_OTHER): Payer: BC Managed Care – PPO | Admitting: Family Medicine

## 2013-03-15 ENCOUNTER — Encounter: Payer: Self-pay | Admitting: Family Medicine

## 2013-03-15 VITALS — BP 134/84 | HR 76 | Ht 68.0 in | Wt 180.0 lb

## 2013-03-15 DIAGNOSIS — M25562 Pain in left knee: Secondary | ICD-10-CM

## 2013-03-15 DIAGNOSIS — M25561 Pain in right knee: Secondary | ICD-10-CM

## 2013-03-15 DIAGNOSIS — M25569 Pain in unspecified knee: Secondary | ICD-10-CM

## 2013-03-15 DIAGNOSIS — M25529 Pain in unspecified elbow: Secondary | ICD-10-CM

## 2013-03-15 DIAGNOSIS — M25539 Pain in unspecified wrist: Secondary | ICD-10-CM

## 2013-03-15 DIAGNOSIS — M25521 Pain in right elbow: Secondary | ICD-10-CM

## 2013-03-15 DIAGNOSIS — M25531 Pain in right wrist: Secondary | ICD-10-CM

## 2013-03-15 MED ORDER — TRAMADOL HCL 50 MG PO TABS: 50.0000 mg | ORAL_TABLET | Freq: Three times a day (TID) | ORAL | Status: DC | PRN

## 2013-03-15 NOTE — Patient Instructions (Addendum)
Take tylenol 500mg  1-2 tabs three times a day for pain. Aleve 1-2 tabs twice a day with food Glucosamine sulfate 750mg  twice a day is a supplement that may help. Capsaicin topically up to four times a day may also help with pain. Tramadol as needed for severe pain in addition to this - may cause constipation.  No driving on this medication. Cortisone injections are an option for your knees. If cortisone injections do not help, there are different types of shots that may help but they take longer to take effect. It's important that you continue to stay active. If you are overweight, try to lose weight through diet and exercise. Consider physical therapy to strengthen muscles around the joint that hurts to take pressure off of the joint itself. Do home exercises daily for wrist and elbow Shoe inserts with good arch support may be helpful. Heat or ice 15 minutes at a time 3-4 times a day as needed to help with pain. We will refer you to the pain clinic.

## 2013-03-17 ENCOUNTER — Encounter: Payer: Self-pay | Admitting: Family Medicine

## 2013-03-17 DIAGNOSIS — M25531 Pain in right wrist: Secondary | ICD-10-CM | POA: Insufficient documentation

## 2013-03-17 DIAGNOSIS — M25561 Pain in right knee: Secondary | ICD-10-CM | POA: Insufficient documentation

## 2013-03-17 DIAGNOSIS — M25521 Pain in right elbow: Secondary | ICD-10-CM | POA: Insufficient documentation

## 2013-03-17 NOTE — Assessment & Plan Note (Signed)
h/o meniscal debridements, repair.  Has done PT, HEP, had intraarticular injections without much benefit.  Discussed repeating radiographs though unlikely to change management - no now injuries to suggest new meniscal tears since prior surgeries.  Discussed tylenol, nsaids, capsaicin, glucosamine.  Will refer to pain management as well.  Consider viscosupplementation.

## 2013-03-17 NOTE — Assessment & Plan Note (Signed)
some evidence of mild lateral epicondylitis - again, will discuss formal PT with his brother.  Consider counterforce brace, do exercises daily.  Has also had injections here but I suspect for intraarticular pathology.  S/p radial head excision.  Consider nitro patches for epicondylitis portion.  Medications as noted above.  Will refer to pain management for this also.

## 2013-03-17 NOTE — Progress Notes (Signed)
Patient ID: Dominic Gay, male   DOB: 1976/03/25, 37 y.o.   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   Records will be scanned into the chart.

## 2013-03-17 NOTE — Progress Notes (Signed)
Patient ID: Dominic Gay, male   DOB: [birth year], 37 y.o.   MRN: [id number]  PCP: Carney Living, MD  Subjective:   HPI: Patient is a 37 y.o. male here for multiple complaints.  Patient returns today with multiple complaints related to prior injuries, surgeries. Chief among them are bilateral knee pain, constant up to 6/10, and right elbow pain. Has history of right radial head excision and lacks about 10 degrees as a result of this, constant deep pain here. Has been seen for knees previously - meniscal debridement/excisions bilaterally, meniscus repair. Right hurts worse than left.  Have tried oral nsaids, intraarticular injections without much relief. Brother is also a physical therapist - regularly does home exercises for right elbow and both knees. Heating and icing as well. No FH rheumatoid arthritis though father sees a rheumatologist for arthritis and is s/p multiple total joint replacements of hips, knees. No h/o psoriasis, gout, other inflammatory conditions. Having some pain at bony prominence dorsal wrist as well that is new. No new injuries.  Past Medical History  Diagnosis Date  . Anxiety   . GERD (gastroesophageal reflux disease)     Current Outpatient Prescriptions on File Prior to Visit  Medication Sig Dispense Refill  . ALPRAZolam (XANAX) 0.5 MG tablet Take 1 tablet (0.5 mg total) by mouth 2 (two) times daily as needed for sleep.  60 tablet  2  . lamoTRIgine (LAMICTAL) 200 MG tablet TAKE 1 TABLET BY MOUTH DAILY  90 tablet  3  . omeprazole (PRILOSEC) 10 MG capsule Take 10 mg by mouth as needed.       No current facility-administered medications on file prior to visit.    Past Surgical History  Procedure Laterality Date  . Meniscus repair  2013  . Elbow surgery      Allergies  Allergen Reactions  . Erythromycin     History   Social History  . Marital Status: Married    Spouse Name: N/A    Number of Children: N/A  . Years of Education: N/A    Occupational History  . Not on file.   Social History Main Topics  . Smoking status: Never Smoker   . Smokeless tobacco: Never Used  . Alcohol Use: Not on file  . Drug Use: No  . Sexual Activity: Yes    Partners: Female   Other Topics Concern  . Not on file   Social History Narrative   Works as Associate Professor at [employer].  Two children 2003 and 2005.  Likes to run and hike    Family History  Problem Relation Age of Onset  . Diabetes Neg Hx   . Heart attack Neg Hx   . Hypertension Neg Hx     BP 134/84  Pulse 76  Ht 5\' 8"  (1.727 m)  Wt 180 lb (81.647 kg)  BMI 27.38 kg/m2  Review of Systems: See HPI above.    Objective:  Physical Exam:  Gen: NAD  Bilateral knees: No gross deformity, ecchymoses, effusions.   Minimal medial > lateral joint line tenderness. FROM. Negative ant/post drawers. Negative valgus/varus testing. Negative lachmanns. Negative mcmurrays, apleys, patellar apprehension, clarkes. NV intact distally.  R elbow: Well healed surgical scar posteriorly.  No other deformity, bruising. Mild lateral epicondyle TTP.  No other TTP. Full flexion but lacks 10 degrees extension.  Minimal supination/pronation.  Strength 5/5 with flexion/extension.  Mild pain with wrist extension but mainly at wrist. Collateral ligaments intact. NVI distally.  R wrist: No  gross deformity, swelling bruising.  Mild TTP ulnar styloid. Pain reproduced with wrist extension and ulnar deviation. NVI distally.    Assessment & Plan:  1. Right wrist pain - consistent with extensor carpi ulnaris tendinopathy.  Start with home exercise program - he will also discuss with his physical therapist brother additional exercises to do.  Discussed considering wrist brace which would help this and partially the elbow pain.  Icing, nsaids, tramadol as needed.  2. Bilateral knee pain - h/o meniscal debridements, repair.  Has done PT, HEP, had intraarticular injections without much  benefit.  Discussed repeating radiographs though unlikely to change management - no now injuries to suggest new meniscal tears since prior surgeries.  Discussed tylenol, nsaids, capsaicin, glucosamine.  Will refer to pain management as well.  Consider viscosupplementation.  3. Right elbow pain - some evidence of mild lateral epicondylitis - again, will discuss formal PT with his brother.  Consider counterforce brace, do exercises daily.  Has also had injections here but I suspect for intraarticular pathology.  S/p radial head excision.  Consider nitro patches for epicondylitis portion.  Medications as noted above.  Will refer to pain management for this also.  Note also we have received records regarding prior surgery, treatment to review.

## 2013-03-17 NOTE — Assessment & Plan Note (Signed)
consistent with extensor carpi ulnaris tendinopathy.  Start with home exercise program - he will also discuss with his physical therapist brother additional exercises to do.  Discussed considering wrist brace which would help this and partially the elbow pain.  Icing, nsaids, tramadol as needed.

## 2013-06-23 ENCOUNTER — Encounter: Payer: Self-pay | Admitting: Family Medicine

## 2013-06-23 ENCOUNTER — Ambulatory Visit (INDEPENDENT_AMBULATORY_CARE_PROVIDER_SITE_OTHER): Payer: BC Managed Care – PPO | Admitting: Family Medicine

## 2013-06-23 VITALS — BP 154/93 | HR 81 | Temp 99.0°F | Wt 180.0 lb

## 2013-06-23 DIAGNOSIS — M9230 Other juvenile osteochondrosis, unspecified upper limb: Secondary | ICD-10-CM

## 2013-06-23 DIAGNOSIS — N451 Epididymitis: Secondary | ICD-10-CM | POA: Insufficient documentation

## 2013-06-23 DIAGNOSIS — N453 Epididymo-orchitis: Secondary | ICD-10-CM

## 2013-06-23 DIAGNOSIS — M9200 Juvenile osteochondrosis of humerus, unspecified arm: Secondary | ICD-10-CM | POA: Insufficient documentation

## 2013-06-23 MED ORDER — CIPROFLOXACIN HCL 500 MG PO TABS: 500.0000 mg | ORAL_TABLET | Freq: Two times a day (BID) | ORAL | Status: DC

## 2013-06-23 NOTE — Patient Instructions (Signed)
Epididymitis  Epididymitis is a swelling (inflammation) of the epididymis. The epididymis is a cord-like structure along the back part of the testicle. Epididymitis is usually, but not always, caused by infection. This is usually a sudden problem beginning with chills, fever and pain behind the scrotum and in the testicle. There may be swelling and redness of the testicle.  DIAGNOSIS   Physical examination will reveal a tender, swollen epididymis. Sometimes, cultures are obtained from the urine or from prostate secretions to help find out if there is an infection or if the cause is a different problem. Sometimes, blood work is performed to see if your white blood cell count is elevated and if a germ (bacterial) or viral infection is present. Using this knowledge, an appropriate medicine which kills germs (antibiotic) can be chosen by your caregiver. A viral infection causing epididymitis will most often go away (resolve) without treatment.  HOME CARE INSTRUCTIONS   · Hot sitz baths for 20 minutes, 4 times per day, may help relieve pain.  · Only take over-the-counter or prescription medicines for pain, discomfort or fever as directed by your caregiver.  · Take all medicines, including antibiotics, as directed. Take the antibiotics for the full prescribed length of time even if you are feeling better.  · It is very important to keep all follow-up appointments.  SEEK IMMEDIATE MEDICAL CARE IF:   · You have a fever.  · You have pain not relieved with medicines.  · You have any worsening of your problems.  · Your pain seems to come and go.  · You develop pain, redness, and swelling in the scrotum and surrounding areas.  MAKE SURE YOU:   · Understand these instructions.  · Will watch your condition.  · Will get help right away if you are not doing well or get worse.  Document Released: 05/30/2000 Document Revised: 08/25/2011 Document Reviewed: 04/19/2009  ExitCare® Patient Information ©2014 [employer].

## 2013-06-23 NOTE — Assessment & Plan Note (Signed)
Treat with Cipro--this worked in past x 10 days.  If no improvement, return here with possible Urology referral. NSAIDS, ice pack and rest.

## 2013-06-23 NOTE — Progress Notes (Signed)
   Subjective:    Patient ID: Dominic Gay, male    DOB: 1975/11/20, 38 y.o.   MRN: [id number]  Testicle Pain The patient's primary symptoms include scrotal swelling and testicular pain. The patient's pertinent negatives include no genital injury, genital itching, genital lesions or penile discharge. This is a new problem. The current episode started yesterday. The problem occurs constantly. The problem has been waxing and waning. The pain is medium. Associated symptoms include abdominal pain. Pertinent negatives include no anorexia, chills, dysuria, fever, flank pain or rash. The testicular pain affects the left testicle. There is swelling in the left testicle. The color of the testicles is normal. Nothing aggravates the symptoms. He has tried nothing for the symptoms. He is sexually active. He never uses condoms. No, his partner does not have an STD. His past medical history is significant for varicocele. There is no history of kidney stones. (H/o epididymitis previously--pain is similar)      Review of Systems  Constitutional: Negative for fever and chills.  Gastrointestinal: Positive for abdominal pain. Negative for anorexia.  Genitourinary: Positive for scrotal swelling and testicular pain. Negative for dysuria, flank pain and discharge.  Musculoskeletal: Positive for arthralgias.  Skin: Negative for rash.       Objective:   Physical Exam  Vitals reviewed. Constitutional: He appears well-developed and well-nourished. No distress.  HENT:  Head: Normocephalic and atraumatic.  Eyes: No scleral icterus.  Neck: Neck supple.  Cardiovascular: Normal rate.   Pulmonary/Chest: Effort normal.  No CVA tenderness  Abdominal: Soft. There is no tenderness.  Genitourinary: Right testis shows no mass, no swelling and no tenderness. Cremasteric reflex is not absent on the right side. Left testis shows swelling and tenderness. Left testis shows no mass. Cremasteric reflex is not absent on the left  side.  Epididymis is tender on left          Assessment & Plan:

## 2013-06-23 NOTE — Assessment & Plan Note (Signed)
Multiple joints--followed by pain specialist on Ultram

## 2013-09-01 ENCOUNTER — Encounter: Payer: Self-pay | Admitting: Family Medicine

## 2013-09-01 ENCOUNTER — Ambulatory Visit (INDEPENDENT_AMBULATORY_CARE_PROVIDER_SITE_OTHER): Payer: BC Managed Care – PPO | Admitting: Family Medicine

## 2013-09-01 VITALS — BP 138/89 | HR 66 | Temp 98.5°F | Ht 68.0 in | Wt 182.3 lb

## 2013-09-01 DIAGNOSIS — J029 Acute pharyngitis, unspecified: Secondary | ICD-10-CM

## 2013-09-01 LAB — POCT RAPID STREP A (OFFICE): Rapid Strep A Screen: NEGATIVE

## 2013-09-01 MED ORDER — IBUPROFEN 600 MG PO TABS: 600.0000 mg | ORAL_TABLET | Freq: Three times a day (TID) | ORAL | Status: AC | PRN

## 2013-09-01 NOTE — Progress Notes (Signed)
Patient ID: Dominic Gay    DOB: [birth year], 38 y.o.   MRN: [id number] --- Subjective:  Dominic Gay is a 38 y.o.male who presents with sore throat. Started on Monday. Pain is located in the left side of his throat. He feels like it is more red and appears bruised on that side. He denies any cough. He denies any fevers or chills. He denies trouble swallowing. He is able to eat and drink normally. Denies any congestion or runny nose. No trouble breathing. He denies any sick contacts. He has had a history of strep throat in the past and feels like this is different.   ROS: see HPI Past Medical History: reviewed and updated medications and allergies. Social History: Tobacco: None  Objective: Filed Vitals:   09/01/13 1449  BP: 138/89  Pulse: 66  Temp: 98.5 F (36.9 C)    Physical Examination:   General appearance - alert, well appearing, and in no distress Ears - bilateral TM's and external ear canals normal Nose - normal and patent, no erythema, discharge or polyps Mouth - moist mucous membranes, oropharynx is erythematous with cobblestone pattern, tonsils are mildly enlarged but without tonsillar exudate. No soft tissue swelling. Tongue Normal. Neck - supple, left sided tender cervical adenopathy Chest - clear to auscultation, no wheezes, rales or rhonchi, symmetric air entry

## 2013-09-01 NOTE — Assessment & Plan Note (Signed)
Stentor criteria 2. Check strep test. If negative treat for viral pharyngitis with ibuprofen 600 mg every 6 hours as needed. If not better in a few days patient was told to return to care.

## 2013-09-01 NOTE — Patient Instructions (Signed)
I am going to send some high dose ibuprofen for the pain.   I suspect that this is a viral pharyngitis but if the swab is positive for strep, I will send antibiotics to the pharmacy.   Pharyngitis Pharyngitis is redness, pain, and swelling (inflammation) of your pharynx.  CAUSES  Pharyngitis is usually caused by infection. Most of the time, these infections are from viruses (viral) and are part of a cold. However, sometimes pharyngitis is caused by bacteria (bacterial). Pharyngitis can also be caused by allergies. Viral pharyngitis may be spread from person to person by coughing, sneezing, and personal items or utensils (cups, forks, spoons, toothbrushes). Bacterial pharyngitis may be spread from person to person by more intimate contact, such as kissing.  SIGNS AND SYMPTOMS  Symptoms of pharyngitis include:   Sore throat.   Tiredness (fatigue).   Low-grade fever.   Headache.  Joint pain and muscle aches.  Skin rashes.  Swollen lymph nodes.  Plaque-like film on throat or tonsils (often seen with bacterial pharyngitis). DIAGNOSIS  Your health care provider will ask you questions about your illness and your symptoms. Your medical history, along with a physical exam, is often all that is needed to diagnose pharyngitis. Sometimes, a rapid strep test is done. Other lab tests may also be done, depending on the suspected cause.  TREATMENT  Viral pharyngitis will usually get better in 3 4 days without the use of medicine. Bacterial pharyngitis is treated with medicines that kill germs (antibiotics).  HOME CARE INSTRUCTIONS   Drink enough water and fluids to keep your urine clear or pale yellow.   Only take over-the-counter or prescription medicines as directed by your health care provider:   If you are prescribed antibiotics, make sure you finish them even if you start to feel better.   Do not take aspirin.   Get lots of rest.   Gargle with 8 oz of salt water ( tsp of salt  per 1 qt of water) as often as every 1 2 hours to soothe your throat.   Throat lozenges (if you are not at risk for choking) or sprays may be used to soothe your throat. SEEK MEDICAL CARE IF:   You have large, tender lumps in your neck.  You have a rash.  You cough up green, yellow-brown, or bloody spit. SEEK IMMEDIATE MEDICAL CARE IF:   Your neck becomes stiff.  You drool or are unable to swallow liquids.  You vomit or are unable to keep medicines or liquids down.  You have severe pain that does not go away with the use of recommended medicines.  You have trouble breathing (not caused by a stuffy nose). MAKE SURE YOU:   Understand these instructions.  Will watch your condition.  Will get help right away if you are not doing well or get worse. Document Released: 06/02/2005 Document Revised: 03/23/2013 Document Reviewed: 02/07/2013 [facility] Patient Information 2014 [employer], [place].

## 2013-11-13 ENCOUNTER — Other Ambulatory Visit: Payer: Self-pay | Admitting: Family Medicine

## 2014-01-10 ENCOUNTER — Other Ambulatory Visit: Payer: Self-pay | Admitting: Family Medicine

## 2014-08-11 ENCOUNTER — Encounter (INDEPENDENT_AMBULATORY_CARE_PROVIDER_SITE_OTHER): Payer: Self-pay

## 2014-08-11 ENCOUNTER — Encounter: Payer: Self-pay | Admitting: Family Medicine

## 2014-08-11 ENCOUNTER — Ambulatory Visit (INDEPENDENT_AMBULATORY_CARE_PROVIDER_SITE_OTHER): Payer: BLUE CROSS/BLUE SHIELD | Admitting: Family Medicine

## 2014-08-11 VITALS — BP 133/82 | HR 64 | Ht 68.0 in | Wt 175.0 lb

## 2014-08-11 DIAGNOSIS — M25531 Pain in right wrist: Secondary | ICD-10-CM

## 2014-08-11 MED ORDER — NITROGLYCERIN 0.2 MG/HR TD PT24: MEDICATED_PATCH | TRANSDERMAL | Status: AC

## 2014-08-11 NOTE — Patient Instructions (Signed)
You have extensor carpi ulnaris tendinitis/tendinopathy. Wrist brace as often as possible to rest this. Consider occupational therapy. Once a day 3 sets of 10 of wrist extension, wrist stretching exercises on the handout. Icing 15 minutes at a time 3-4 times a day. Ibuprofen or aleve for pain and inflammation. Nitro patches - 1/4th patch over affected area, change daily. Follow up with me in 6 weeks or as needed.

## 2014-08-14 NOTE — Assessment & Plan Note (Signed)
2/2 extensor carpi ulnaris tendinopathy.  Wrist brace with icing, nsaids as needed.  Will trial nitro patches for this given has been present for 4 months - discussed risks of headache, skin irritation.  F/u in 6 weeks.  Consider occupational therapy as well.

## 2014-08-14 NOTE — Progress Notes (Signed)
PCP: Carney LivingHAMBLISS,MARSHALL L, MD  Subjective:   HPI: Patient is a 39 y.o. male here for right wrist pain.  Patient reports for about 4 months without injury or increase in activity level he has had worsening dorsal right wrist pain. More on ulnar side. Is ambidextrous. Slight swelling. Feels mainly with motions of the wrist. Not tried anything for this to date.  Past Medical History  Diagnosis Date  . Anxiety   . GERD (gastroesophageal reflux disease)     Current Outpatient Prescriptions on File Prior to Visit  Medication Sig Dispense Refill  . ALPRAZolam (XANAX) 0.5 MG tablet Take 1 tablet (0.5 mg total) by mouth 2 (two) times daily as needed for sleep. 60 tablet 2  . ciprofloxacin (CIPRO) 500 MG tablet Take 1 tablet (500 mg total) by mouth 2 (two) times daily. 20 tablet 0  . ibuprofen (ADVIL,MOTRIN) 600 MG tablet Take 1 tablet (600 mg total) by mouth every 8 (eight) hours as needed. 30 tablet 0  . lamoTRIgine (LAMICTAL) 200 MG tablet TAKE 1 TABLET BY MOUTH EVERY DAY 30 tablet 2  . omeprazole (PRILOSEC) 10 MG capsule Take 10 mg by mouth as needed.    . traMADol (ULTRAM) 50 MG tablet Take 1 tablet (50 mg total) by mouth every 8 (eight) hours as needed for pain. 90 tablet 0   No current facility-administered medications on file prior to visit.    Past Surgical History  Procedure Laterality Date  . Meniscus repair  2013  . Elbow surgery      Allergies  Allergen Reactions  . Erythromycin     History   Social History  . Marital Status: Married    Spouse Name: N/A  . Number of Children: N/A  . Years of Education: N/A   Occupational History  . Not on file.   Social History Main Topics  . Smoking status: Never Smoker   . Smokeless tobacco: Never Used  . Alcohol Use: Not on file  . Drug Use: No  . Sexual Activity:    Partners: Female   Other Topics Concern  . 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  Two children  2003 and 2005.  Likes to run and hike    Family History  Problem Relation Age of Onset  . Diabetes Neg Hx   . Heart attack Neg Hx   . Hypertension Neg Hx     BP 133/82 mmHg  Pulse 64  Ht 5\' 8"  (1.727 m)  Wt 175 lb (79.379 kg)  BMI 26.61 kg/m2  Review of Systems: See HPI above.    Objective:  Physical Exam:  Gen: NAD  Right wrist: No gross deformity, swelling, bruising. TTP at ulnar styloid dorsally and just proximal to this. Pain with ulnar deviation and wrist extension in same location.  No other tenderness. FROM with 5/5 strength. NVI distally.  MSK u/s: No evidence cortical irregularity, edema, neovascularity of distal ulna.  No tears of extensor carpi ulnaris tendon    Assessment & Plan:  1. Right wrist pain - 2/2 extensor carpi ulnaris tendinopathy.  Wrist brace with icing, nsaids as needed.  Will trial nitro patches for this given has been present for 4 months - discussed risks of headache, skin irritation.  F/u in 6 weeks.  Consider occupational therapy as well.

## 2014-09-25 ENCOUNTER — Other Ambulatory Visit: Payer: Self-pay | Admitting: Family Medicine

## 2014-09-25 NOTE — Telephone Encounter (Signed)
Pt called and would like to get a refill on his Imitrex. He hasn't needed it until now. [facility] [place] [facility] [place] jw

## 2014-09-26 MED ORDER — SUMATRIPTAN SUCCINATE 100 MG PO TABS
100.0000 mg | ORAL_TABLET | ORAL | Status: AC | PRN
Start: 2014-09-26 — End: ?

## 2014-09-26 NOTE — Telephone Encounter (Signed)
Sent in Please let him know he needs an office visit with me as has been about 2 years Thanks LC

## 2014-09-26 NOTE — Telephone Encounter (Signed)
Spoke with pt, scheduled for office visit 10/04/14 with Dr. Deirdre Priesthambliss at 8:45 Dominic Gay

## 2014-10-04 ENCOUNTER — Ambulatory Visit (INDEPENDENT_AMBULATORY_CARE_PROVIDER_SITE_OTHER): Payer: BLUE CROSS/BLUE SHIELD | Admitting: Family Medicine

## 2014-10-04 ENCOUNTER — Encounter: Payer: Self-pay | Admitting: Family Medicine

## 2014-10-04 VITALS — BP 122/80 | HR 68 | Temp 98.6°F | Ht 68.0 in | Wt 184.0 lb

## 2014-10-04 DIAGNOSIS — F3170 Bipolar disorder, currently in remission, most recent episode unspecified: Secondary | ICD-10-CM

## 2014-10-04 DIAGNOSIS — G43009 Migraine without aura, not intractable, without status migrainosus: Secondary | ICD-10-CM | POA: Diagnosis not present

## 2014-10-04 DIAGNOSIS — Z13220 Encounter for screening for lipoid disorders: Secondary | ICD-10-CM | POA: Diagnosis not present

## 2014-10-04 DIAGNOSIS — G43909 Migraine, unspecified, not intractable, without status migrainosus: Secondary | ICD-10-CM | POA: Insufficient documentation

## 2014-10-04 MED ORDER — ATENOLOL 25 MG PO TABS: 25.0000 mg | ORAL_TABLET | Freq: Every day | ORAL | Status: AC

## 2014-10-04 NOTE — Assessment & Plan Note (Signed)
Stable.  Continue lamictal

## 2014-10-04 NOTE — Patient Instructions (Signed)
Good to see you today!  Thanks for coming in.  Try the atenolol 25 mg before bed for at least 3 weeks  If you have worsening symptoms or any neurologic symptoms - vision changes or balance changes  Come in for fasting labs

## 2014-10-04 NOTE — Progress Notes (Signed)
   Subjective:    Patient ID: Dominic Gay, male    DOB: 1976/03/09, 39 y.o.   MRN: [id number]  HPI  Headaches Has noticed and increase in his typical migraines over the last few weeks.   Often awakens him from sleep as has done in past.  Feels groggy when he has them.  Usually takes execedrin and coffee and they resolve.  Havnig several per week now.  Rarely uses sumatriptan.  No nausea or vomiting no focal weakness or visuall symptoms or weakness.  Takes ibuprofen once a day and uses small patch of ntg for musculoskeletal  Pain.  No change in this.  Does not drink caffienne usually   PMH - had MRI about 2 years ago for similare symptoms that was normal   Bipolar Mood is well controlled.  No problems at work or home.  Takes lamictal regularly.  No rash or abdomen pain.    Chief Complaint noted Review of Symptoms - see HPI PMH - Smoking status noted.   Vital Signs reviewed     Review of Systems     Objective:   Physical Exam Alert no acute distress Neurologic exam : Cn 2-7 intact Strength equal & normal in upper & lower extremities Able to walk on heels and toes.   Balance normal  Romberg normal, finger to nose normal Eye - Pupils Equal Round Reactive to light, Extraocular movements intact, Fundi without hemorrhage or visible lesions, Conjunctiva without redness or discharge Lungs:  Normal respiratory effort, chest expands symmetrically. Lungs are clear to auscultation, no crackles or wheezes. Heart - Regular rate and rhythm.  No murmurs, gallops or rubs.            Assessment & Plan:

## 2014-10-04 NOTE — Assessment & Plan Note (Signed)
Worsened.  His AM awakening with pain is atypical but no other warning signs of CNS focal disease and had similar symptoms with normal MRI  In ast.  No idetifiable triggers.  Will start low dose atenolol for prophylaxis and observe

## 2014-10-11 ENCOUNTER — Other Ambulatory Visit: Payer: Self-pay | Admitting: Family Medicine

## 2014-12-27 ENCOUNTER — Other Ambulatory Visit: Payer: Self-pay | Admitting: Family Medicine

## 2014-12-28 NOTE — Telephone Encounter (Signed)
Rx called into pharmacy. Edelyn Heidel, CMA. 

## 2014-12-28 NOTE — Telephone Encounter (Signed)
Pls call in xanax rx Thanks

## 2015-01-08 ENCOUNTER — Other Ambulatory Visit: Payer: Self-pay | Admitting: Family Medicine

## 2015-02-05 ENCOUNTER — Encounter: Payer: Self-pay | Admitting: Family Medicine

## 2015-02-05 ENCOUNTER — Ambulatory Visit (INDEPENDENT_AMBULATORY_CARE_PROVIDER_SITE_OTHER): Payer: BLUE CROSS/BLUE SHIELD | Admitting: Family Medicine

## 2015-02-05 VITALS — BP 136/91 | HR 71 | Ht 69.0 in | Wt 180.0 lb

## 2015-02-05 DIAGNOSIS — M25511 Pain in right shoulder: Secondary | ICD-10-CM | POA: Diagnosis not present

## 2015-02-05 MED ORDER — METHYLPREDNISOLONE ACETATE 40 MG/ML IJ SUSP
40.0000 mg | Freq: Once | INTRAMUSCULAR | Status: AC
  Administered 2015-02-05: 40 mg via INTRA_ARTICULAR

## 2015-02-05 NOTE — Patient Instructions (Signed)
You have rotator cuff impingement, subacromial bursitis Try to avoid painful activities (overhead activities, lifting with extended arm) as much as possible. Consider aleve 2 tabs twice a day with food OR ibuprofen 3 tabs three times a day with food for pain and inflammation. Can take tylenol in addition to this. Subacromial injection may be beneficial to help with pain and to decrease inflammation - you were given this today. Consider physical therapy with transition to home exercise program. Do home exercise program with theraband and scapular stabilization exercises daily - these are very important for long term relief even if an injection was given. If not improving at follow-up we will consider further imaging, physical therapy. Follow up with me in 5-6 weeks.

## 2015-02-07 DIAGNOSIS — M25511 Pain in right shoulder: Secondary | ICD-10-CM | POA: Insufficient documentation

## 2015-02-07 NOTE — Progress Notes (Signed)
PCP: Carney Living, MD  Subjective:   HPI: Patient is a 39 y.o. male here for right shoulder pain.  Patient denies known injury. Right shoulder has worsened over past 3 weeks. No increase in activity level in this time. Remote history of bursitis issues. Has tried stretching. Would like injection. + night pain Pain 5/10 at rest, up to 7/10 with overhead activities.  Past Medical History  Diagnosis Date  . Anxiety   . GERD (gastroesophageal reflux disease)     Current Outpatient Prescriptions on File Prior to Visit  Medication Sig Dispense Refill  . ALPRAZolam (XANAX) 0.5 MG tablet Take 1 tablet (0.5 mg total) by mouth at bedtime as needed for anxiety. 30 tablet 2  . atenolol (TENORMIN) 25 MG tablet Take 1 tablet (25 mg total) by mouth daily. 30 tablet 1  . ibuprofen (ADVIL,MOTRIN) 600 MG tablet Take 1 tablet (600 mg total) by mouth every 8 (eight) hours as needed. 30 tablet 0  . lamoTRIgine (LAMICTAL) 200 MG tablet TAKE 1 TABLET BY MOUTH EVERY DAY 30 tablet 11  . nitroGLYCERIN (NITRODUR - DOSED IN MG/24 HR) 0.2 mg/hr patch Apply 1/4th patch over affected tendon, change daily 30 patch 1  . omeprazole (PRILOSEC) 10 MG capsule Take 10 mg by mouth as needed.    . SUMAtriptan (IMITREX) 100 MG tablet Take 1 tablet (100 mg total) by mouth every 2 (two) hours as needed for migraine. May repeat in 2 hours if headache persists or recurs. 10 tablet 0   No current facility-administered medications on file prior to visit.    Past Surgical History  Procedure Laterality Date  . Meniscus repair  2013  . Elbow surgery      Allergies  Allergen Reactions  . Erythromycin     Social History   Social History  . Marital Status: Married    Spouse Name: N/A  . Number of Children: N/A  . Years of Education: N/A   Occupational History  . Not on file.   Social History Main Topics  . Smoking status: Never Smoker   . Smokeless tobacco: Never Used  . Alcohol Use: Not on file  .  Drug Use: No  . Sexual Activity:    Partners: Female   Other Topics Concern  . Not on file   Social History Narrative   Works as Associate Professor at [employer].  Two children 2003 and 2005.  Likes to run and hike    Family History  Problem Relation Age of Onset  . Diabetes Neg Hx   . Heart attack Neg Hx   . Hypertension Neg Hx     BP 136/91 mmHg  Pulse 71  Ht  (1.753 m)  Wt 180 lb (81.647 kg)  BMI 26.57 kg/m2  Review of Systems: See HPI above.    Objective:  Physical Exam:  Gen: NAD  Right shoulder: No swelling, ecchymoses.  No gross deformity. No TTP. FROM with painful arc. Positive Hawkins, Neers. Negative Speeds, Yergasons. Strength 5/5 with empty can and resisted internal/external rotation.  Pain empty can, mild. Negative apprehension. NV intact distally.    Assessment & Plan:  1. Right shoulder pain - 2/2 rotator cuff impingement, bursitis.  Injection given today.  Shown home exercises to do daily.  Icing as needed, nsaids/tylenol as needed.  F/u in 5-6 weeks.  Consider imaging, physical therapy if not improving.  After informed written consent, patient was seated on exam table. Right shoulder was prepped with alcohol swab  and utilizing posterior approach, patient's right subacromial space was injected with 3:1 marcaine: depomedrol. Patient tolerated the procedure well without immediate complications.

## 2015-02-07 NOTE — Assessment & Plan Note (Signed)
2/2 rotator cuff impingement, bursitis.  Injection given today.  Shown home exercises to do daily.  Icing as needed, nsaids/tylenol as needed.  F/u in 5-6 weeks.  Consider imaging, physical therapy if not improving.  After informed written consent, patient was seated on exam table. Right shoulder was prepped with alcohol swab and utilizing posterior approach, patient's right subacromial space was injected with 3:1 marcaine: depomedrol. Patient tolerated the procedure well without immediate complications.

## 2016-02-05 ENCOUNTER — Other Ambulatory Visit: Payer: Self-pay | Admitting: Family Medicine

## 2016-02-05 DIAGNOSIS — F3170 Bipolar disorder, currently in remission, most recent episode unspecified: Secondary | ICD-10-CM

## 2016-07-15 DIAGNOSIS — H66001 Acute suppurative otitis media without spontaneous rupture of ear drum, right ear: Secondary | ICD-10-CM | POA: Diagnosis not present

## 2016-07-15 DIAGNOSIS — J0100 Acute maxillary sinusitis, unspecified: Secondary | ICD-10-CM | POA: Diagnosis not present

## 2016-09-08 DIAGNOSIS — M2550 Pain in unspecified joint: Secondary | ICD-10-CM | POA: Diagnosis not present

## 2016-09-08 DIAGNOSIS — R200 Anesthesia of skin: Secondary | ICD-10-CM | POA: Diagnosis not present

## 2016-10-14 DIAGNOSIS — M542 Cervicalgia: Secondary | ICD-10-CM | POA: Diagnosis not present

## 2016-10-14 DIAGNOSIS — F3170 Bipolar disorder, currently in remission, most recent episode unspecified: Secondary | ICD-10-CM | POA: Diagnosis not present

## 2016-10-14 DIAGNOSIS — F411 Generalized anxiety disorder: Secondary | ICD-10-CM | POA: Diagnosis not present

## 2016-11-14 DIAGNOSIS — M5412 Radiculopathy, cervical region: Secondary | ICD-10-CM | POA: Diagnosis not present

## 2017-01-29 DIAGNOSIS — M9903 Segmental and somatic dysfunction of lumbar region: Secondary | ICD-10-CM | POA: Diagnosis not present

## 2017-01-29 DIAGNOSIS — M545 Low back pain: Secondary | ICD-10-CM | POA: Diagnosis not present

## 2017-01-29 DIAGNOSIS — M9900 Segmental and somatic dysfunction of head region: Secondary | ICD-10-CM | POA: Diagnosis not present

## 2017-01-29 DIAGNOSIS — M542 Cervicalgia: Secondary | ICD-10-CM | POA: Diagnosis not present

## 2017-01-29 DIAGNOSIS — M9901 Segmental and somatic dysfunction of cervical region: Secondary | ICD-10-CM | POA: Diagnosis not present

## 2017-02-14 DIAGNOSIS — J029 Acute pharyngitis, unspecified: Secondary | ICD-10-CM | POA: Diagnosis not present

## 2017-02-25 DIAGNOSIS — M5412 Radiculopathy, cervical region: Secondary | ICD-10-CM | POA: Diagnosis not present

## 2017-02-25 DIAGNOSIS — M542 Cervicalgia: Secondary | ICD-10-CM | POA: Diagnosis not present

## 2017-03-16 DIAGNOSIS — M9329 Osteochondritis dissecans multiple sites: Secondary | ICD-10-CM | POA: Diagnosis not present

## 2017-03-16 DIAGNOSIS — M5412 Radiculopathy, cervical region: Secondary | ICD-10-CM | POA: Diagnosis not present

## 2017-03-16 DIAGNOSIS — M436 Torticollis: Secondary | ICD-10-CM | POA: Diagnosis not present

## 2017-04-01 DIAGNOSIS — M436 Torticollis: Secondary | ICD-10-CM | POA: Diagnosis not present

## 2017-04-01 DIAGNOSIS — M9329 Osteochondritis dissecans multiple sites: Secondary | ICD-10-CM | POA: Diagnosis not present

## 2017-04-01 DIAGNOSIS — M5412 Radiculopathy, cervical region: Secondary | ICD-10-CM | POA: Diagnosis not present

## 2017-04-29 DIAGNOSIS — M5412 Radiculopathy, cervical region: Secondary | ICD-10-CM | POA: Diagnosis not present

## 2017-04-29 DIAGNOSIS — R030 Elevated blood-pressure reading, without diagnosis of hypertension: Secondary | ICD-10-CM | POA: Diagnosis not present

## 2017-04-29 DIAGNOSIS — Z6830 Body mass index (BMI) 30.0-30.9, adult: Secondary | ICD-10-CM | POA: Diagnosis not present

## 2017-06-05 DIAGNOSIS — F411 Generalized anxiety disorder: Secondary | ICD-10-CM | POA: Diagnosis not present

## 2017-06-05 DIAGNOSIS — F3170 Bipolar disorder, currently in remission, most recent episode unspecified: Secondary | ICD-10-CM | POA: Diagnosis not present

## 2017-06-05 DIAGNOSIS — R030 Elevated blood-pressure reading, without diagnosis of hypertension: Secondary | ICD-10-CM | POA: Diagnosis not present

## 2017-07-08 DIAGNOSIS — M9329 Osteochondritis dissecans multiple sites: Secondary | ICD-10-CM | POA: Diagnosis not present

## 2017-07-08 DIAGNOSIS — M150 Primary generalized (osteo)arthritis: Secondary | ICD-10-CM | POA: Diagnosis not present

## 2017-07-08 DIAGNOSIS — M2550 Pain in unspecified joint: Secondary | ICD-10-CM | POA: Diagnosis not present

## 2017-07-08 DIAGNOSIS — M542 Cervicalgia: Secondary | ICD-10-CM | POA: Diagnosis not present

## 2017-07-28 DIAGNOSIS — F419 Anxiety disorder, unspecified: Secondary | ICD-10-CM | POA: Diagnosis not present

## 2017-07-28 DIAGNOSIS — M25521 Pain in right elbow: Secondary | ICD-10-CM | POA: Diagnosis not present

## 2017-07-28 DIAGNOSIS — M25529 Pain in unspecified elbow: Secondary | ICD-10-CM | POA: Diagnosis not present

## 2017-07-28 DIAGNOSIS — Z9889 Other specified postprocedural states: Secondary | ICD-10-CM | POA: Diagnosis not present

## 2017-07-28 DIAGNOSIS — K219 Gastro-esophageal reflux disease without esophagitis: Secondary | ICD-10-CM | POA: Diagnosis not present

## 2017-07-28 DIAGNOSIS — M25621 Stiffness of right elbow, not elsewhere classified: Secondary | ICD-10-CM | POA: Diagnosis not present

## 2017-07-28 DIAGNOSIS — M9320 Osteochondritis dissecans of unspecified site: Secondary | ICD-10-CM | POA: Diagnosis not present

## 2017-08-21 DIAGNOSIS — M93221 Osteochondritis dissecans, right elbow: Secondary | ICD-10-CM | POA: Diagnosis not present

## 2017-08-21 DIAGNOSIS — M9201 Juvenile osteochondrosis of humerus, right arm: Secondary | ICD-10-CM | POA: Diagnosis not present

## 2017-08-21 DIAGNOSIS — K219 Gastro-esophageal reflux disease without esophagitis: Secondary | ICD-10-CM | POA: Diagnosis not present

## 2017-08-21 DIAGNOSIS — Z881 Allergy status to other antibiotic agents status: Secondary | ICD-10-CM | POA: Diagnosis not present

## 2017-08-21 DIAGNOSIS — G8918 Other acute postprocedural pain: Secondary | ICD-10-CM | POA: Diagnosis not present

## 2017-08-21 DIAGNOSIS — M25521 Pain in right elbow: Secondary | ICD-10-CM | POA: Diagnosis not present

## 2017-08-21 DIAGNOSIS — M79601 Pain in right arm: Secondary | ICD-10-CM | POA: Diagnosis not present

## 2017-08-21 DIAGNOSIS — F419 Anxiety disorder, unspecified: Secondary | ICD-10-CM | POA: Diagnosis not present

## 2017-08-21 DIAGNOSIS — M19021 Primary osteoarthritis, right elbow: Secondary | ICD-10-CM | POA: Diagnosis not present

## 2017-08-22 DIAGNOSIS — M79601 Pain in right arm: Secondary | ICD-10-CM | POA: Diagnosis not present

## 2017-08-22 DIAGNOSIS — G8918 Other acute postprocedural pain: Secondary | ICD-10-CM | POA: Diagnosis not present

## 2017-09-01 DIAGNOSIS — F411 Generalized anxiety disorder: Secondary | ICD-10-CM | POA: Diagnosis not present

## 2017-09-01 DIAGNOSIS — F3170 Bipolar disorder, currently in remission, most recent episode unspecified: Secondary | ICD-10-CM | POA: Diagnosis not present

## 2017-09-02 DIAGNOSIS — Z96621 Presence of right artificial elbow joint: Secondary | ICD-10-CM | POA: Diagnosis not present

## 2017-09-02 DIAGNOSIS — M19021 Primary osteoarthritis, right elbow: Secondary | ICD-10-CM | POA: Diagnosis not present

## 2017-09-22 DIAGNOSIS — Z79891 Long term (current) use of opiate analgesic: Secondary | ICD-10-CM | POA: Diagnosis not present

## 2017-09-22 DIAGNOSIS — G894 Chronic pain syndrome: Secondary | ICD-10-CM | POA: Diagnosis not present

## 2017-09-22 DIAGNOSIS — M47816 Spondylosis without myelopathy or radiculopathy, lumbar region: Secondary | ICD-10-CM | POA: Diagnosis not present

## 2017-09-22 DIAGNOSIS — M170 Bilateral primary osteoarthritis of knee: Secondary | ICD-10-CM | POA: Diagnosis not present

## 2017-09-22 DIAGNOSIS — M791 Myalgia, unspecified site: Secondary | ICD-10-CM | POA: Diagnosis not present

## 2017-10-01 DIAGNOSIS — S42441A Displaced fracture (avulsion) of medial epicondyle of right humerus, initial encounter for closed fracture: Secondary | ICD-10-CM | POA: Diagnosis not present

## 2017-10-01 DIAGNOSIS — R030 Elevated blood-pressure reading, without diagnosis of hypertension: Secondary | ICD-10-CM | POA: Diagnosis not present

## 2017-10-06 DIAGNOSIS — S42464A Nondisplaced fracture of medial condyle of right humerus, initial encounter for closed fracture: Secondary | ICD-10-CM | POA: Diagnosis not present

## 2017-10-08 DIAGNOSIS — M47816 Spondylosis without myelopathy or radiculopathy, lumbar region: Secondary | ICD-10-CM | POA: Diagnosis not present

## 2017-10-08 DIAGNOSIS — G894 Chronic pain syndrome: Secondary | ICD-10-CM | POA: Diagnosis not present

## 2017-10-08 DIAGNOSIS — M170 Bilateral primary osteoarthritis of knee: Secondary | ICD-10-CM | POA: Diagnosis not present

## 2017-10-08 DIAGNOSIS — M791 Myalgia, unspecified site: Secondary | ICD-10-CM | POA: Diagnosis not present

## 2017-11-03 DIAGNOSIS — S42444D Nondisplaced fracture (avulsion) of medial epicondyle of right humerus, subsequent encounter for fracture with routine healing: Secondary | ICD-10-CM | POA: Diagnosis not present

## 2017-11-05 DIAGNOSIS — M47816 Spondylosis without myelopathy or radiculopathy, lumbar region: Secondary | ICD-10-CM | POA: Diagnosis not present

## 2017-11-05 DIAGNOSIS — M791 Myalgia, unspecified site: Secondary | ICD-10-CM | POA: Diagnosis not present

## 2017-11-05 DIAGNOSIS — G894 Chronic pain syndrome: Secondary | ICD-10-CM | POA: Diagnosis not present

## 2017-11-05 DIAGNOSIS — M170 Bilateral primary osteoarthritis of knee: Secondary | ICD-10-CM | POA: Diagnosis not present

## 2018-01-04 DIAGNOSIS — M47816 Spondylosis without myelopathy or radiculopathy, lumbar region: Secondary | ICD-10-CM | POA: Diagnosis not present

## 2018-01-04 DIAGNOSIS — M791 Myalgia, unspecified site: Secondary | ICD-10-CM | POA: Diagnosis not present

## 2018-01-04 DIAGNOSIS — G894 Chronic pain syndrome: Secondary | ICD-10-CM | POA: Diagnosis not present

## 2018-01-04 DIAGNOSIS — M170 Bilateral primary osteoarthritis of knee: Secondary | ICD-10-CM | POA: Diagnosis not present

## 2018-01-26 DIAGNOSIS — Z9889 Other specified postprocedural states: Secondary | ICD-10-CM | POA: Diagnosis not present

## 2018-01-26 DIAGNOSIS — T148XXA Other injury of unspecified body region, initial encounter: Secondary | ICD-10-CM | POA: Diagnosis not present

## 2018-01-26 DIAGNOSIS — S42301D Unspecified fracture of shaft of humerus, right arm, subsequent encounter for fracture with routine healing: Secondary | ICD-10-CM | POA: Diagnosis not present

## 2018-01-26 DIAGNOSIS — S42444D Nondisplaced fracture (avulsion) of medial epicondyle of right humerus, subsequent encounter for fracture with routine healing: Secondary | ICD-10-CM | POA: Diagnosis not present

## 2018-01-26 DIAGNOSIS — Z96621 Presence of right artificial elbow joint: Secondary | ICD-10-CM | POA: Diagnosis not present

## 2018-02-03 DIAGNOSIS — S5001XA Contusion of right elbow, initial encounter: Secondary | ICD-10-CM | POA: Diagnosis not present

## 2018-02-06 DIAGNOSIS — S5001XD Contusion of right elbow, subsequent encounter: Secondary | ICD-10-CM | POA: Diagnosis not present

## 2018-02-08 ENCOUNTER — Ambulatory Visit (INDEPENDENT_AMBULATORY_CARE_PROVIDER_SITE_OTHER): Payer: BLUE CROSS/BLUE SHIELD

## 2018-02-08 ENCOUNTER — Ambulatory Visit (INDEPENDENT_AMBULATORY_CARE_PROVIDER_SITE_OTHER): Payer: BLUE CROSS/BLUE SHIELD | Admitting: Orthopaedic Surgery

## 2018-02-08 ENCOUNTER — Encounter (INDEPENDENT_AMBULATORY_CARE_PROVIDER_SITE_OTHER): Payer: Self-pay | Admitting: Orthopaedic Surgery

## 2018-02-08 DIAGNOSIS — G8929 Other chronic pain: Secondary | ICD-10-CM | POA: Diagnosis not present

## 2018-02-08 DIAGNOSIS — M25561 Pain in right knee: Secondary | ICD-10-CM | POA: Diagnosis not present

## 2018-02-08 MED ORDER — LIDOCAINE HCL 1 % IJ SOLN
3.0000 mL | INTRAMUSCULAR | Status: AC | PRN
  Administered 2018-02-08: 3 mL

## 2018-02-08 MED ORDER — METHYLPREDNISOLONE ACETATE 40 MG/ML IJ SUSP
40.0000 mg | INTRAMUSCULAR | Status: AC | PRN
Start: 2018-02-08 — End: 2018-02-08
  Administered 2018-02-08: 40 mg via INTRA_ARTICULAR

## 2018-02-08 NOTE — Progress Notes (Signed)
Office Visit Note   Patient: Dominic Gay           Date of Birth: 04-Jan-1976           MRN: [id number] Visit Date: 02/08/2018              Requested by: Carney Livinghambliss, Marshall L, MD [address] [place], [place] [address] PCP: Carney Livinghambliss, Marshall L, MD   Assessment & Plan: Visit Diagnoses:  1. Chronic pain of right knee     Plan: He is very active individual and he does have strong quads.  He does not need physical therapy but he will continue quad training exercises on his own.  I did talk about trying a steroid injection in his right knee today and he is agreeable to this.  I explained the risks and benefits of injections as well.  He may end up being a candidate for hyaluronic acid as well.  He tolerated the steroid injection well.  I will see him back in a few weeks to see how is doing with this.  If he still having the mechanical symptoms of locking catching then we would obtain an MRI of his right knee to rule out a meniscal tear.  All questions concerns were answered and addressed.  Follow-Up Instructions: Return in about 2 weeks (around 02/22/2018).   Orders:  Orders Placed This Encounter  Procedures  . Large Joint Inj  . XR Knee 1-2 Views Right   No orders of the defined types were placed in this encounter.     Procedures: Large Joint Inj: R knee on 02/08/2018 10:20 AM Indications: diagnostic evaluation and pain Details: 22 G 1.5 in needle, superolateral approach  Arthrogram: No  Medications: 3 mL lidocaine 1 %; 40 mg methylPREDNISolone acetate 40 MG/ML Outcome: tolerated well, no immediate complications Procedure, treatment alternatives, risks and benefits explained, specific risks discussed. Consent was given by the patient. Immediately prior to procedure a time out was called to verify the correct patient, procedure, equipment, support staff and site/side marked as required. Patient was prepped and draped in the usual sterile fashion.        Clinical Data: No additional findings.   Subjective: Chief Complaint  Patient presents with  . Right Knee - Pain  The patient comes in with chief complaint of right knee pain is been getting slowly worse for several years now.  He has been having swelling and locking catching as well.  He has remote surgery on that knee around 2007 for a meniscal tear with a right knee arthroscopy.  In 2014 he had left knee arthroscopy for similar issue.  His pain is become daily.  He started to walk with a limp.  It is detrimentally affect is active daily living, his quality of life, and his mobility.  He is not a diabetic.  He is an active individual.  His BMI is only 26.6.  He was someone that did weigh 315 pounds at his heaviest.  Of also note, he did have a right elbow replacement in March of this year [place].  HPI  Review of Systems He currently denies any headache, chest pain, shortness of breath, fever, chills, nausea, vomiting.  Objective: Vital Signs: There were no vitals taken for this visit.  Physical Exam He is alert and oriented x3 and in no acute distress Ortho Exam He does ambulate with a slight limp favoring his right knee.  His right knee shows no knee  joint effusion.  He has significant lateral joint line tenderness and a positive Murray sign to the lateral side.  He has well-healed surgical incision.  The extremes of flexion extension does cause knee pain on the lateral aspect of the joint. Specialty Comments:  No specialty comments available.  Imaging: Xr Knee 1-2 Views Right  Result Date: 02/08/2018 2 views of the right knee show no acute findings.  There is just slight medial joint space narrowing and slight varus malalignment.    PMFS History: Patient Active Problem List   Diagnosis Date Noted  . Right shoulder pain 02/07/2015  . Migraine 10/04/2014  . Bilateral knee pain 03/17/2013  . Right elbow pain 03/17/2013  . Right wrist pain 03/17/2013   . Bipolar disorder (HCC) 03/22/2008  . ANXIETY STATE NOS 09/23/2007   Past Medical History:  Diagnosis Date  . Anxiety   . GERD (gastroesophageal reflux disease)     Family History  Problem Relation Age of Onset  . Diabetes Neg Hx   . Heart attack Neg Hx   . Hypertension Neg Hx     Past Surgical History:  Procedure Laterality Date  . ELBOW SURGERY    . MENISCUS REPAIR  2013   Social History   Occupational History  . Not on file  Tobacco Use  . Smoking status: Never Smoker  . Smokeless tobacco: Never Used  Substance and Sexual Activity  . Alcohol use: Not on file  . Drug use: No  . Sexual activity: Yes    Partners: Female

## 2018-02-23 ENCOUNTER — Ambulatory Visit (INDEPENDENT_AMBULATORY_CARE_PROVIDER_SITE_OTHER): Payer: BLUE CROSS/BLUE SHIELD | Admitting: Orthopaedic Surgery

## 2018-02-23 ENCOUNTER — Encounter (INDEPENDENT_AMBULATORY_CARE_PROVIDER_SITE_OTHER): Payer: Self-pay | Admitting: Orthopaedic Surgery

## 2018-02-23 ENCOUNTER — Other Ambulatory Visit (INDEPENDENT_AMBULATORY_CARE_PROVIDER_SITE_OTHER): Payer: Self-pay

## 2018-02-23 DIAGNOSIS — M25561 Pain in right knee: Principal | ICD-10-CM

## 2018-02-23 DIAGNOSIS — G8929 Other chronic pain: Secondary | ICD-10-CM

## 2018-02-23 NOTE — Progress Notes (Signed)
The patient is continue to follow-up with right knee pain.  This is been a chronic issue for him but he is developed locking and catching.  He does have a remote history of a right knee arthroscopy with a partial medial meniscectomy.  This was done elsewhere.  In the interim he is work on Dance movement psychotherapist exercises and he has lost a tremendous amount of weight.  He is very physically fit at this point.  We have tried activity modification as well as anti-inflammatories.  He has had a steroid injection in the right knee as well.  He is still having knee pain with locking catching in spite of all of his conservative efforts.  On exam his quads are nice and strong his range of motion is full of his right knee however he has medial joint tenderness and a positive McMurray sign the medial compartment of the knee.  When she start flexing her past 90 degrees she gets a significant amount of medial pain.  The knee has a negative Lockman's exam.  This point an MRI is warranted to assess the cartilage in the meniscus of the right knee given the very conservative treatment measures and his continued mechanical symptoms of locking catching and pain.  We will see him back after the MRI is obtained.  All question concerns were answered and addressed.

## 2018-03-06 ENCOUNTER — Ambulatory Visit
Admission: RE | Admit: 2018-03-06 | Discharge: 2018-03-06 | Disposition: A | Discharge status: Home / Self Care | Payer: BLUE CROSS/BLUE SHIELD | Source: Ambulatory Visit | Attending: Orthopaedic Surgery | Admitting: Orthopaedic Surgery

## 2018-03-06 DIAGNOSIS — G8929 Other chronic pain: Secondary | ICD-10-CM

## 2018-03-06 DIAGNOSIS — M25561 Pain in right knee: Principal | ICD-10-CM

## 2018-03-08 DIAGNOSIS — M170 Bilateral primary osteoarthritis of knee: Secondary | ICD-10-CM | POA: Diagnosis not present

## 2018-03-08 DIAGNOSIS — G894 Chronic pain syndrome: Secondary | ICD-10-CM | POA: Diagnosis not present

## 2018-03-08 DIAGNOSIS — M791 Myalgia, unspecified site: Secondary | ICD-10-CM | POA: Diagnosis not present

## 2018-03-08 DIAGNOSIS — M47816 Spondylosis without myelopathy or radiculopathy, lumbar region: Secondary | ICD-10-CM | POA: Diagnosis not present

## 2018-03-09 ENCOUNTER — Encounter (INDEPENDENT_AMBULATORY_CARE_PROVIDER_SITE_OTHER): Payer: Self-pay | Admitting: Orthopaedic Surgery

## 2018-03-09 ENCOUNTER — Telehealth (INDEPENDENT_AMBULATORY_CARE_PROVIDER_SITE_OTHER): Payer: Self-pay

## 2018-03-09 ENCOUNTER — Ambulatory Visit (INDEPENDENT_AMBULATORY_CARE_PROVIDER_SITE_OTHER): Payer: BLUE CROSS/BLUE SHIELD | Admitting: Orthopaedic Surgery

## 2018-03-09 DIAGNOSIS — M1711 Unilateral primary osteoarthritis, right knee: Secondary | ICD-10-CM

## 2018-03-09 NOTE — Telephone Encounter (Signed)
synvisc one right knee

## 2018-03-09 NOTE — Progress Notes (Signed)
Office Visit Note   Patient: Dominic Gay W Spinello           Date of Birth: [birth year]           MRN: [id number] Visit Date: 03/09/2018              Requested by: Carney Livinghambliss, Marshall L, MD [address] [place], [place] [address] PCP: Darrow BussingKoirala, Dibas, MD   Assessment & Plan: Visit Diagnoses:  1. Primary osteoarthritis of right knee     Plan: He will work with his brother on quad strengthening.  We will also order Synvisc 1 injection for him to have him back once this is available.  Discussed anti-inflammatories with him he finds that ibuprofen works better than anything else he is tried if we will have him continue this.  Also suggested to Tumeric, dried cherries and glucosamine/chondroitin.  He is tried the Tumeric without any real relief.  Suggested he may try the other 2 natural and anti-inflammatories I discussed with him.  Questions were encouraged and answered at length.  Follow-Up Instructions: Return for Supplemental injection.   Orders:  No orders of the defined types were placed in this encounter.  No orders of the defined types were placed in this encounter.     Procedures: No procedures performed   Clinical Data: No additional findings.   Subjective: Chief Complaint  Patient presents with  . Right Knee - Pain, Follow-up    HPI Dominic Gay returns today follow-up of his MRI of his right knee.  He states he continues to have pain in the knee.  He has pain in the knee primarily whenever he is sitting for prolonged period time or going up and down inclines.  He is taking ibuprofen for pain. MRI right knee dated 03/06/2018 is reviewed with the patient.  No acute meniscal tear.  Medial meniscus with postoperative changes from previous partial meniscectomy.  Moderately advanced patellofemoral changes.  Mild chondromalacia medial compartment no full-thickness cartilage loss.  Lateral compartment well-preserved.   Review of Systems Please see HPI otherwise  negative  Objective: Vital Signs: There were no vitals taken for this visit.  Physical Exam  Constitutional: He is oriented to person, place, and time. He appears well-developed and well-nourished. No distress.  Pulmonary/Chest: Effort normal.  Neurological: He is alert and oriented to person, place, and time.  Skin: He is not diaphoretic.    Ortho Exam  Specialty Comments:  No specialty comments available.  Imaging: No results found.   PMFS History: Patient Active Problem List   Diagnosis Date Noted  . Right shoulder pain 02/07/2015  . Migraine 10/04/2014  . Bilateral knee pain 03/17/2013  . Right elbow pain 03/17/2013  . Right wrist pain 03/17/2013  . Bipolar disorder (HCC) 03/22/2008  . ANXIETY STATE NOS 09/23/2007   Past Medical History:  Diagnosis Date  . Anxiety   . GERD (gastroesophageal reflux disease)     Family History  Problem Relation Age of Onset  . Diabetes Neg Hx   . Heart attack Neg Hx   . Hypertension Neg Hx     Past Surgical History:  Procedure Laterality Date  . ELBOW SURGERY    . MENISCUS REPAIR  2013   Social History   Occupational History  . Not on file  Tobacco Use  . Smoking status: Never Smoker  . Smokeless tobacco: Never Used  Substance and Sexual Activity  . Alcohol use: Not on file  . Drug use: No  . Sexual activity:  Yes    Partners: Female

## 2018-03-10 NOTE — Telephone Encounter (Signed)
Noted  

## 2018-03-12 DIAGNOSIS — F3170 Bipolar disorder, currently in remission, most recent episode unspecified: Secondary | ICD-10-CM | POA: Diagnosis not present

## 2018-03-12 DIAGNOSIS — F411 Generalized anxiety disorder: Secondary | ICD-10-CM | POA: Diagnosis not present

## 2018-03-22 ENCOUNTER — Telehealth (INDEPENDENT_AMBULATORY_CARE_PROVIDER_SITE_OTHER): Payer: Self-pay

## 2018-03-22 NOTE — Telephone Encounter (Signed)
Submitted VOB for SynviscOne, right knee. 

## 2018-03-26 ENCOUNTER — Telehealth (INDEPENDENT_AMBULATORY_CARE_PROVIDER_SITE_OTHER): Payer: Self-pay

## 2018-03-26 NOTE — Telephone Encounter (Signed)
PA required for SynviscOne, right knee. Faxed completed PA form to [employer] at [phone]. 

## 2018-03-29 ENCOUNTER — Telehealth (INDEPENDENT_AMBULATORY_CARE_PROVIDER_SITE_OTHER): Payer: Self-pay

## 2018-03-29 NOTE — Telephone Encounter (Signed)
Patient is approved for SynviscOne, right knee. Buy & Bill Covered at 100% of the allowed amount No Co-pay PA required PA Approval#[id number] Valid 03/26/2018- 03/26/2019  Appt.03/31/2018

## 2018-03-30 DIAGNOSIS — M47816 Spondylosis without myelopathy or radiculopathy, lumbar region: Secondary | ICD-10-CM | POA: Diagnosis not present

## 2018-03-31 ENCOUNTER — Encounter (INDEPENDENT_AMBULATORY_CARE_PROVIDER_SITE_OTHER): Payer: Self-pay | Admitting: Orthopaedic Surgery

## 2018-03-31 ENCOUNTER — Ambulatory Visit (INDEPENDENT_AMBULATORY_CARE_PROVIDER_SITE_OTHER): Payer: BLUE CROSS/BLUE SHIELD | Admitting: Orthopaedic Surgery

## 2018-03-31 DIAGNOSIS — M1711 Unilateral primary osteoarthritis, right knee: Secondary | ICD-10-CM | POA: Diagnosis not present

## 2018-03-31 MED ORDER — HYLAN G-F 20 48 MG/6ML IX SOSY
48.0000 mg | PREFILLED_SYRINGE | INTRA_ARTICULAR | Status: AC | PRN
  Administered 2018-03-31: 48 mg via INTRA_ARTICULAR

## 2018-03-31 NOTE — Progress Notes (Signed)
   Procedure Note  Patient: Dominic Gay             Date of Birth: 1975/10/06           MRN: [id number]             Visit Date: 03/31/2018  Procedures: Visit Diagnoses: Primary osteoarthritis of right knee  Large Joint Inj: R knee on 03/31/2018 3:44 PM Indications: pain and diagnostic evaluation Details: 22 G 1.5 in needle, superolateral approach  Arthrogram: No  Medications: 48 mg Hylan 48 MG/6ML Outcome: tolerated well, no immediate complications Procedure, treatment alternatives, risks and benefits explained, specific risks discussed. Consent was given by the patient. Immediately prior to procedure a time out was called to verify the correct patient, procedure, equipment, support staff and site/side marked as required. Patient was prepped and draped in the usual sterile fashion.    Patient comes in today for scheduled hyaluronic acid injection with Synvisc 1 in the right knee to treat the pain from moderate osteoarthritis.  He is very active and pleasant 42 year old gentleman who has had knee arthroscopies in the past.  He is lost a significant weight over time and is very active.  His knee pain is been daily.  Is mainly medial but can be global.  He is already had a steroid injection as well and an MRI of his knee.  On exam today there is no effusion of his right knee.  I was able to place the hyaluronic acid with Synvisc 1 to the superior aspect of his knee without difficulty.  All question concerns were answered and addressed.  Follow-up will be as needed.

## 2018-04-13 DIAGNOSIS — M47816 Spondylosis without myelopathy or radiculopathy, lumbar region: Secondary | ICD-10-CM | POA: Diagnosis not present

## 2018-05-03 DIAGNOSIS — M170 Bilateral primary osteoarthritis of knee: Secondary | ICD-10-CM | POA: Diagnosis not present

## 2018-05-03 DIAGNOSIS — G894 Chronic pain syndrome: Secondary | ICD-10-CM | POA: Diagnosis not present

## 2018-05-03 DIAGNOSIS — M791 Myalgia, unspecified site: Secondary | ICD-10-CM | POA: Diagnosis not present

## 2018-05-03 DIAGNOSIS — M47816 Spondylosis without myelopathy or radiculopathy, lumbar region: Secondary | ICD-10-CM | POA: Diagnosis not present

## 2018-05-21 DIAGNOSIS — M545 Low back pain: Secondary | ICD-10-CM | POA: Diagnosis not present

## 2018-05-26 ENCOUNTER — Ambulatory Visit
Admission: RE | Admit: 2018-05-26 | Discharge: 2018-05-26 | Disposition: A | Discharge status: Home / Self Care | Payer: BLUE CROSS/BLUE SHIELD | Source: Ambulatory Visit | Attending: Physical Medicine and Rehabilitation | Admitting: Physical Medicine and Rehabilitation

## 2018-05-26 ENCOUNTER — Other Ambulatory Visit: Payer: Self-pay | Admitting: Physical Medicine and Rehabilitation

## 2018-05-26 DIAGNOSIS — M545 Low back pain, unspecified: Secondary | ICD-10-CM

## 2018-06-02 DIAGNOSIS — M545 Low back pain: Secondary | ICD-10-CM | POA: Diagnosis not present

## 2018-06-07 DIAGNOSIS — M545 Low back pain: Secondary | ICD-10-CM | POA: Diagnosis not present

## 2018-06-15 DIAGNOSIS — M545 Low back pain: Secondary | ICD-10-CM | POA: Diagnosis not present

## 2018-06-28 DIAGNOSIS — G894 Chronic pain syndrome: Secondary | ICD-10-CM | POA: Diagnosis not present

## 2018-06-28 DIAGNOSIS — M791 Myalgia, unspecified site: Secondary | ICD-10-CM | POA: Diagnosis not present

## 2018-06-28 DIAGNOSIS — Z79891 Long term (current) use of opiate analgesic: Secondary | ICD-10-CM | POA: Diagnosis not present

## 2018-06-28 DIAGNOSIS — M47816 Spondylosis without myelopathy or radiculopathy, lumbar region: Secondary | ICD-10-CM | POA: Diagnosis not present

## 2018-06-28 DIAGNOSIS — M170 Bilateral primary osteoarthritis of knee: Secondary | ICD-10-CM | POA: Diagnosis not present

## 2018-08-02 DIAGNOSIS — M170 Bilateral primary osteoarthritis of knee: Secondary | ICD-10-CM | POA: Diagnosis not present

## 2018-08-02 DIAGNOSIS — M47816 Spondylosis without myelopathy or radiculopathy, lumbar region: Secondary | ICD-10-CM | POA: Diagnosis not present

## 2018-08-02 DIAGNOSIS — M791 Myalgia, unspecified site: Secondary | ICD-10-CM | POA: Diagnosis not present

## 2018-08-02 DIAGNOSIS — G894 Chronic pain syndrome: Secondary | ICD-10-CM | POA: Diagnosis not present

## 2018-08-31 DIAGNOSIS — M47816 Spondylosis without myelopathy or radiculopathy, lumbar region: Secondary | ICD-10-CM | POA: Diagnosis not present

## 2018-08-31 DIAGNOSIS — G894 Chronic pain syndrome: Secondary | ICD-10-CM | POA: Diagnosis not present

## 2018-08-31 DIAGNOSIS — M791 Myalgia, unspecified site: Secondary | ICD-10-CM | POA: Diagnosis not present

## 2018-08-31 DIAGNOSIS — M170 Bilateral primary osteoarthritis of knee: Secondary | ICD-10-CM | POA: Diagnosis not present

## 2018-09-23 DIAGNOSIS — F411 Generalized anxiety disorder: Secondary | ICD-10-CM | POA: Diagnosis not present

## 2018-09-23 DIAGNOSIS — F3170 Bipolar disorder, currently in remission, most recent episode unspecified: Secondary | ICD-10-CM | POA: Diagnosis not present

## 2018-09-23 DIAGNOSIS — G43009 Migraine without aura, not intractable, without status migrainosus: Secondary | ICD-10-CM | POA: Diagnosis not present

## 2018-10-26 DIAGNOSIS — M47816 Spondylosis without myelopathy or radiculopathy, lumbar region: Secondary | ICD-10-CM | POA: Diagnosis not present

## 2018-10-26 DIAGNOSIS — G894 Chronic pain syndrome: Secondary | ICD-10-CM | POA: Diagnosis not present

## 2018-10-26 DIAGNOSIS — M791 Myalgia, unspecified site: Secondary | ICD-10-CM | POA: Diagnosis not present

## 2018-10-26 DIAGNOSIS — M170 Bilateral primary osteoarthritis of knee: Secondary | ICD-10-CM | POA: Diagnosis not present

## 2018-12-08 DIAGNOSIS — M47816 Spondylosis without myelopathy or radiculopathy, lumbar region: Secondary | ICD-10-CM | POA: Diagnosis not present

## 2018-12-22 DIAGNOSIS — M791 Myalgia, unspecified site: Secondary | ICD-10-CM | POA: Diagnosis not present

## 2018-12-22 DIAGNOSIS — M170 Bilateral primary osteoarthritis of knee: Secondary | ICD-10-CM | POA: Diagnosis not present

## 2018-12-22 DIAGNOSIS — G894 Chronic pain syndrome: Secondary | ICD-10-CM | POA: Diagnosis not present

## 2018-12-22 DIAGNOSIS — M47816 Spondylosis without myelopathy or radiculopathy, lumbar region: Secondary | ICD-10-CM | POA: Diagnosis not present

## 2019-02-17 DIAGNOSIS — Z03818 Encounter for observation for suspected exposure to other biological agents ruled out: Secondary | ICD-10-CM | POA: Diagnosis not present

## 2019-02-22 DIAGNOSIS — Z03818 Encounter for observation for suspected exposure to other biological agents ruled out: Secondary | ICD-10-CM | POA: Diagnosis not present

## 2019-02-23 DIAGNOSIS — M47816 Spondylosis without myelopathy or radiculopathy, lumbar region: Secondary | ICD-10-CM | POA: Diagnosis not present

## 2019-02-23 DIAGNOSIS — M170 Bilateral primary osteoarthritis of knee: Secondary | ICD-10-CM | POA: Diagnosis not present

## 2019-02-23 DIAGNOSIS — M791 Myalgia, unspecified site: Secondary | ICD-10-CM | POA: Diagnosis not present

## 2019-02-23 DIAGNOSIS — G894 Chronic pain syndrome: Secondary | ICD-10-CM | POA: Diagnosis not present

## 2019-03-09 DIAGNOSIS — Z96621 Presence of right artificial elbow joint: Secondary | ICD-10-CM | POA: Diagnosis not present

## 2019-03-09 DIAGNOSIS — M19021 Primary osteoarthritis, right elbow: Secondary | ICD-10-CM | POA: Diagnosis not present

## 2019-03-09 DIAGNOSIS — Z471 Aftercare following joint replacement surgery: Secondary | ICD-10-CM | POA: Diagnosis not present

## 2019-03-24 DIAGNOSIS — M47816 Spondylosis without myelopathy or radiculopathy, lumbar region: Secondary | ICD-10-CM | POA: Diagnosis not present

## 2019-03-24 DIAGNOSIS — M791 Myalgia, unspecified site: Secondary | ICD-10-CM | POA: Diagnosis not present

## 2019-03-24 DIAGNOSIS — G894 Chronic pain syndrome: Secondary | ICD-10-CM | POA: Diagnosis not present

## 2019-03-24 DIAGNOSIS — M170 Bilateral primary osteoarthritis of knee: Secondary | ICD-10-CM | POA: Diagnosis not present

## 2019-03-30 DIAGNOSIS — E669 Obesity, unspecified: Secondary | ICD-10-CM | POA: Diagnosis not present

## 2019-05-19 DIAGNOSIS — G894 Chronic pain syndrome: Secondary | ICD-10-CM | POA: Diagnosis not present

## 2019-05-19 DIAGNOSIS — M47816 Spondylosis without myelopathy or radiculopathy, lumbar region: Secondary | ICD-10-CM | POA: Diagnosis not present

## 2019-05-19 DIAGNOSIS — M7911 Myalgia of mastication muscle: Secondary | ICD-10-CM | POA: Diagnosis not present

## 2019-05-19 DIAGNOSIS — M170 Bilateral primary osteoarthritis of knee: Secondary | ICD-10-CM | POA: Diagnosis not present

## 2019-06-27 DIAGNOSIS — Z03818 Encounter for observation for suspected exposure to other biological agents ruled out: Secondary | ICD-10-CM | POA: Diagnosis not present

## 2019-07-20 DIAGNOSIS — M791 Myalgia, unspecified site: Secondary | ICD-10-CM | POA: Diagnosis not present

## 2019-07-20 DIAGNOSIS — G894 Chronic pain syndrome: Secondary | ICD-10-CM | POA: Diagnosis not present

## 2019-07-20 DIAGNOSIS — M170 Bilateral primary osteoarthritis of knee: Secondary | ICD-10-CM | POA: Diagnosis not present

## 2019-07-20 DIAGNOSIS — M47816 Spondylosis without myelopathy or radiculopathy, lumbar region: Secondary | ICD-10-CM | POA: Diagnosis not present

## 2019-08-17 DIAGNOSIS — G8929 Other chronic pain: Secondary | ICD-10-CM | POA: Diagnosis not present

## 2019-08-17 DIAGNOSIS — M25511 Pain in right shoulder: Secondary | ICD-10-CM | POA: Diagnosis not present

## 2019-08-17 DIAGNOSIS — M25531 Pain in right wrist: Secondary | ICD-10-CM | POA: Diagnosis not present

## 2019-08-17 DIAGNOSIS — M25521 Pain in right elbow: Secondary | ICD-10-CM | POA: Diagnosis not present

## 2019-08-30 DIAGNOSIS — M47816 Spondylosis without myelopathy or radiculopathy, lumbar region: Secondary | ICD-10-CM | POA: Diagnosis not present

## 2019-08-30 DIAGNOSIS — M170 Bilateral primary osteoarthritis of knee: Secondary | ICD-10-CM | POA: Diagnosis not present

## 2019-08-30 DIAGNOSIS — G894 Chronic pain syndrome: Secondary | ICD-10-CM | POA: Diagnosis not present

## 2019-08-30 DIAGNOSIS — M791 Myalgia, unspecified site: Secondary | ICD-10-CM | POA: Diagnosis not present

## 2019-09-22 DIAGNOSIS — R635 Abnormal weight gain: Secondary | ICD-10-CM | POA: Diagnosis not present

## 2019-09-22 DIAGNOSIS — E782 Mixed hyperlipidemia: Secondary | ICD-10-CM | POA: Diagnosis not present

## 2019-09-22 DIAGNOSIS — E559 Vitamin D deficiency, unspecified: Secondary | ICD-10-CM | POA: Diagnosis not present

## 2019-09-22 DIAGNOSIS — R5383 Other fatigue: Secondary | ICD-10-CM | POA: Diagnosis not present

## 2019-09-22 DIAGNOSIS — M545 Low back pain: Secondary | ICD-10-CM | POA: Diagnosis not present

## 2019-09-22 DIAGNOSIS — M542 Cervicalgia: Secondary | ICD-10-CM | POA: Diagnosis not present

## 2019-09-22 DIAGNOSIS — E291 Testicular hypofunction: Secondary | ICD-10-CM | POA: Diagnosis not present

## 2019-09-26 DIAGNOSIS — G894 Chronic pain syndrome: Secondary | ICD-10-CM | POA: Diagnosis not present

## 2019-09-26 DIAGNOSIS — M791 Myalgia, unspecified site: Secondary | ICD-10-CM | POA: Diagnosis not present

## 2019-09-26 DIAGNOSIS — M47816 Spondylosis without myelopathy or radiculopathy, lumbar region: Secondary | ICD-10-CM | POA: Diagnosis not present

## 2019-09-26 DIAGNOSIS — M170 Bilateral primary osteoarthritis of knee: Secondary | ICD-10-CM | POA: Diagnosis not present

## 2019-09-28 DIAGNOSIS — M545 Low back pain: Secondary | ICD-10-CM | POA: Diagnosis not present

## 2019-09-28 DIAGNOSIS — M542 Cervicalgia: Secondary | ICD-10-CM | POA: Diagnosis not present

## 2019-10-03 DIAGNOSIS — E782 Mixed hyperlipidemia: Secondary | ICD-10-CM | POA: Diagnosis not present

## 2019-10-03 DIAGNOSIS — E559 Vitamin D deficiency, unspecified: Secondary | ICD-10-CM | POA: Diagnosis not present

## 2019-10-03 DIAGNOSIS — E291 Testicular hypofunction: Secondary | ICD-10-CM | POA: Diagnosis not present

## 2019-10-03 DIAGNOSIS — M2559 Pain in other specified joint: Secondary | ICD-10-CM | POA: Diagnosis not present

## 2019-10-05 DIAGNOSIS — M545 Low back pain: Secondary | ICD-10-CM | POA: Diagnosis not present

## 2019-10-11 DIAGNOSIS — E291 Testicular hypofunction: Secondary | ICD-10-CM | POA: Diagnosis not present

## 2019-10-11 DIAGNOSIS — Z6831 Body mass index (BMI) 31.0-31.9, adult: Secondary | ICD-10-CM | POA: Diagnosis not present

## 2019-10-11 DIAGNOSIS — E782 Mixed hyperlipidemia: Secondary | ICD-10-CM | POA: Diagnosis not present

## 2019-10-12 DIAGNOSIS — R7401 Elevation of levels of liver transaminase levels: Secondary | ICD-10-CM | POA: Diagnosis not present

## 2019-10-12 DIAGNOSIS — M542 Cervicalgia: Secondary | ICD-10-CM | POA: Diagnosis not present

## 2019-10-12 DIAGNOSIS — E7800 Pure hypercholesterolemia, unspecified: Secondary | ICD-10-CM | POA: Diagnosis not present

## 2019-10-12 DIAGNOSIS — M545 Low back pain: Secondary | ICD-10-CM | POA: Diagnosis not present

## 2019-10-26 DIAGNOSIS — R7401 Elevation of levels of liver transaminase levels: Secondary | ICD-10-CM | POA: Diagnosis not present

## 2019-10-26 DIAGNOSIS — E7800 Pure hypercholesterolemia, unspecified: Secondary | ICD-10-CM | POA: Diagnosis not present

## 2019-10-27 DIAGNOSIS — M791 Myalgia, unspecified site: Secondary | ICD-10-CM | POA: Diagnosis not present

## 2019-10-27 DIAGNOSIS — M47816 Spondylosis without myelopathy or radiculopathy, lumbar region: Secondary | ICD-10-CM | POA: Diagnosis not present

## 2019-10-27 DIAGNOSIS — M170 Bilateral primary osteoarthritis of knee: Secondary | ICD-10-CM | POA: Diagnosis not present

## 2019-10-27 DIAGNOSIS — G894 Chronic pain syndrome: Secondary | ICD-10-CM | POA: Diagnosis not present

## 2019-10-28 DIAGNOSIS — E7800 Pure hypercholesterolemia, unspecified: Secondary | ICD-10-CM | POA: Diagnosis not present

## 2019-10-28 DIAGNOSIS — R7401 Elevation of levels of liver transaminase levels: Secondary | ICD-10-CM | POA: Diagnosis not present

## 2019-11-10 ENCOUNTER — Ambulatory Visit
Admission: RE | Admit: 2019-11-10 | Discharge: 2019-11-10 | Disposition: A | Discharge status: Home / Self Care | Payer: BLUE CROSS/BLUE SHIELD | Source: Ambulatory Visit | Attending: Physical Medicine and Rehabilitation | Admitting: Physical Medicine and Rehabilitation

## 2019-11-10 ENCOUNTER — Other Ambulatory Visit: Payer: Self-pay

## 2019-11-10 ENCOUNTER — Other Ambulatory Visit: Payer: Self-pay | Admitting: Physical Medicine and Rehabilitation

## 2019-11-10 DIAGNOSIS — M542 Cervicalgia: Secondary | ICD-10-CM

## 2019-11-17 DIAGNOSIS — M25521 Pain in right elbow: Secondary | ICD-10-CM | POA: Diagnosis not present

## 2019-11-17 DIAGNOSIS — M25531 Pain in right wrist: Secondary | ICD-10-CM | POA: Diagnosis not present

## 2019-11-22 DIAGNOSIS — M5412 Radiculopathy, cervical region: Secondary | ICD-10-CM | POA: Diagnosis not present

## 2019-11-22 DIAGNOSIS — G5621 Lesion of ulnar nerve, right upper limb: Secondary | ICD-10-CM | POA: Diagnosis not present

## 2019-12-22 DIAGNOSIS — G894 Chronic pain syndrome: Secondary | ICD-10-CM | POA: Diagnosis not present

## 2019-12-22 DIAGNOSIS — M170 Bilateral primary osteoarthritis of knee: Secondary | ICD-10-CM | POA: Diagnosis not present

## 2019-12-22 DIAGNOSIS — M47816 Spondylosis without myelopathy or radiculopathy, lumbar region: Secondary | ICD-10-CM | POA: Diagnosis not present

## 2019-12-22 DIAGNOSIS — M791 Myalgia, unspecified site: Secondary | ICD-10-CM | POA: Diagnosis not present

## 2019-12-29 DIAGNOSIS — M25531 Pain in right wrist: Secondary | ICD-10-CM | POA: Diagnosis not present

## 2020-01-11 DIAGNOSIS — M79631 Pain in right forearm: Secondary | ICD-10-CM | POA: Diagnosis not present

## 2020-01-11 DIAGNOSIS — R52 Pain, unspecified: Secondary | ICD-10-CM | POA: Diagnosis not present

## 2020-02-10 DIAGNOSIS — Z881 Allergy status to other antibiotic agents status: Secondary | ICD-10-CM | POA: Diagnosis not present

## 2020-02-10 DIAGNOSIS — Z96622 Presence of left artificial elbow joint: Secondary | ICD-10-CM | POA: Diagnosis not present

## 2020-02-10 DIAGNOSIS — M19031 Primary osteoarthritis, right wrist: Secondary | ICD-10-CM | POA: Diagnosis not present

## 2020-02-10 DIAGNOSIS — K219 Gastro-esophageal reflux disease without esophagitis: Secondary | ICD-10-CM | POA: Diagnosis not present

## 2020-02-10 DIAGNOSIS — Z79899 Other long term (current) drug therapy: Secondary | ICD-10-CM | POA: Diagnosis not present

## 2020-02-10 DIAGNOSIS — F419 Anxiety disorder, unspecified: Secondary | ICD-10-CM | POA: Diagnosis not present

## 2020-02-10 DIAGNOSIS — M25531 Pain in right wrist: Secondary | ICD-10-CM | POA: Diagnosis not present

## 2020-02-13 DIAGNOSIS — M6281 Muscle weakness (generalized): Secondary | ICD-10-CM | POA: Diagnosis not present

## 2020-02-13 DIAGNOSIS — M25531 Pain in right wrist: Secondary | ICD-10-CM | POA: Diagnosis not present

## 2020-02-13 DIAGNOSIS — Z4789 Encounter for other orthopedic aftercare: Secondary | ICD-10-CM | POA: Diagnosis not present

## 2020-02-13 DIAGNOSIS — M25521 Pain in right elbow: Secondary | ICD-10-CM | POA: Diagnosis not present

## 2020-02-16 DIAGNOSIS — M791 Myalgia, unspecified site: Secondary | ICD-10-CM | POA: Diagnosis not present

## 2020-02-16 DIAGNOSIS — M170 Bilateral primary osteoarthritis of knee: Secondary | ICD-10-CM | POA: Diagnosis not present

## 2020-02-16 DIAGNOSIS — M47816 Spondylosis without myelopathy or radiculopathy, lumbar region: Secondary | ICD-10-CM | POA: Diagnosis not present

## 2020-02-16 DIAGNOSIS — G894 Chronic pain syndrome: Secondary | ICD-10-CM | POA: Diagnosis not present

## 2020-02-23 DIAGNOSIS — Z96631 Presence of right artificial wrist joint: Secondary | ICD-10-CM | POA: Diagnosis not present

## 2020-02-23 DIAGNOSIS — Z96621 Presence of right artificial elbow joint: Secondary | ICD-10-CM | POA: Diagnosis not present

## 2020-02-23 DIAGNOSIS — Z471 Aftercare following joint replacement surgery: Secondary | ICD-10-CM | POA: Diagnosis not present

## 2020-02-23 DIAGNOSIS — M25521 Pain in right elbow: Secondary | ICD-10-CM | POA: Diagnosis not present

## 2020-02-29 DIAGNOSIS — Z4789 Encounter for other orthopedic aftercare: Secondary | ICD-10-CM | POA: Diagnosis not present

## 2020-02-29 DIAGNOSIS — M25531 Pain in right wrist: Secondary | ICD-10-CM | POA: Diagnosis not present

## 2020-02-29 DIAGNOSIS — M25521 Pain in right elbow: Secondary | ICD-10-CM | POA: Diagnosis not present

## 2020-02-29 DIAGNOSIS — M6281 Muscle weakness (generalized): Secondary | ICD-10-CM | POA: Diagnosis not present

## 2020-03-22 DIAGNOSIS — Z9889 Other specified postprocedural states: Secondary | ICD-10-CM | POA: Diagnosis not present

## 2020-03-22 DIAGNOSIS — Z96631 Presence of right artificial wrist joint: Secondary | ICD-10-CM | POA: Diagnosis not present

## 2020-03-22 DIAGNOSIS — M25531 Pain in right wrist: Secondary | ICD-10-CM | POA: Diagnosis not present

## 2020-03-28 DIAGNOSIS — M25531 Pain in right wrist: Secondary | ICD-10-CM | POA: Diagnosis not present

## 2020-03-28 DIAGNOSIS — M79644 Pain in right finger(s): Secondary | ICD-10-CM | POA: Diagnosis not present

## 2020-03-28 DIAGNOSIS — M25631 Stiffness of right wrist, not elsewhere classified: Secondary | ICD-10-CM | POA: Diagnosis not present

## 2020-03-28 DIAGNOSIS — M19041 Primary osteoarthritis, right hand: Secondary | ICD-10-CM | POA: Diagnosis not present

## 2020-04-04 DIAGNOSIS — M25631 Stiffness of right wrist, not elsewhere classified: Secondary | ICD-10-CM | POA: Diagnosis not present

## 2020-04-04 DIAGNOSIS — M79644 Pain in right finger(s): Secondary | ICD-10-CM | POA: Diagnosis not present

## 2020-04-04 DIAGNOSIS — M19041 Primary osteoarthritis, right hand: Secondary | ICD-10-CM | POA: Diagnosis not present

## 2020-04-04 DIAGNOSIS — M25531 Pain in right wrist: Secondary | ICD-10-CM | POA: Diagnosis not present

## 2020-04-05 DIAGNOSIS — M1712 Unilateral primary osteoarthritis, left knee: Secondary | ICD-10-CM | POA: Diagnosis not present

## 2020-04-05 DIAGNOSIS — Y999 Unspecified external cause status: Secondary | ICD-10-CM | POA: Diagnosis not present

## 2020-04-05 DIAGNOSIS — S83282A Other tear of lateral meniscus, current injury, left knee, initial encounter: Secondary | ICD-10-CM | POA: Diagnosis not present

## 2020-04-12 DIAGNOSIS — M47816 Spondylosis without myelopathy or radiculopathy, lumbar region: Secondary | ICD-10-CM | POA: Diagnosis not present

## 2020-04-12 DIAGNOSIS — M791 Myalgia, unspecified site: Secondary | ICD-10-CM | POA: Diagnosis not present

## 2020-04-12 DIAGNOSIS — G894 Chronic pain syndrome: Secondary | ICD-10-CM | POA: Diagnosis not present

## 2020-04-12 DIAGNOSIS — M170 Bilateral primary osteoarthritis of knee: Secondary | ICD-10-CM | POA: Diagnosis not present

## 2020-04-12 DIAGNOSIS — Z79891 Long term (current) use of opiate analgesic: Secondary | ICD-10-CM | POA: Diagnosis not present

## 2020-04-27 DIAGNOSIS — M1712 Unilateral primary osteoarthritis, left knee: Secondary | ICD-10-CM | POA: Diagnosis not present

## 2020-05-02 DIAGNOSIS — Z96631 Presence of right artificial wrist joint: Secondary | ICD-10-CM | POA: Diagnosis not present

## 2020-05-02 DIAGNOSIS — Z471 Aftercare following joint replacement surgery: Secondary | ICD-10-CM | POA: Diagnosis not present

## 2020-06-12 DIAGNOSIS — M170 Bilateral primary osteoarthritis of knee: Secondary | ICD-10-CM | POA: Diagnosis not present

## 2020-06-12 DIAGNOSIS — M47816 Spondylosis without myelopathy or radiculopathy, lumbar region: Secondary | ICD-10-CM | POA: Diagnosis not present

## 2020-06-12 DIAGNOSIS — G894 Chronic pain syndrome: Secondary | ICD-10-CM | POA: Diagnosis not present

## 2020-06-12 DIAGNOSIS — M791 Myalgia, unspecified site: Secondary | ICD-10-CM | POA: Diagnosis not present

## 2020-06-14 DIAGNOSIS — S83272D Complex tear of lateral meniscus, current injury, left knee, subsequent encounter: Secondary | ICD-10-CM | POA: Diagnosis not present

## 2021-08-22 IMAGING — CR DG CERVICAL SPINE COMPLETE 4+V
5 series · 5 of 5 positions shown · non-contrast
Comparison: February 25, 2017.

CLINICAL DATA: Chronic neck and right arm pain without known
injury.

EXAM:
CERVICAL SPINE - COMPLETE 4+ VIEW

[w cervical spine lat]
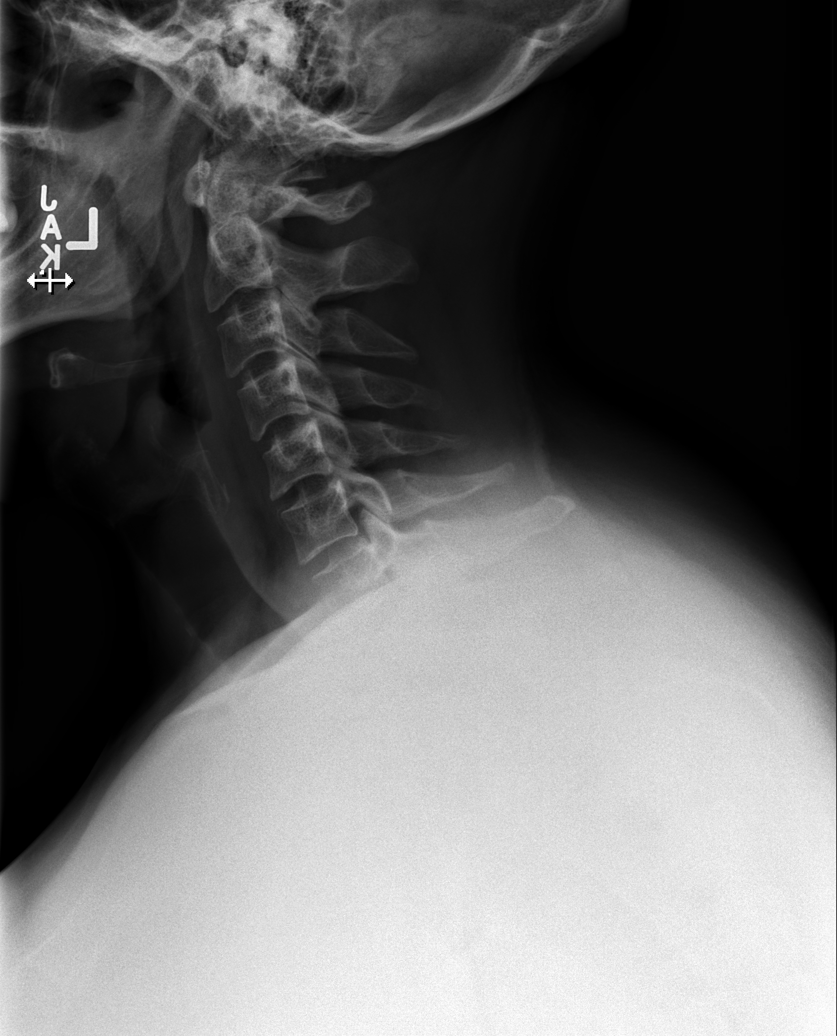

[w cervical spine ap_obl (1 of 2)]
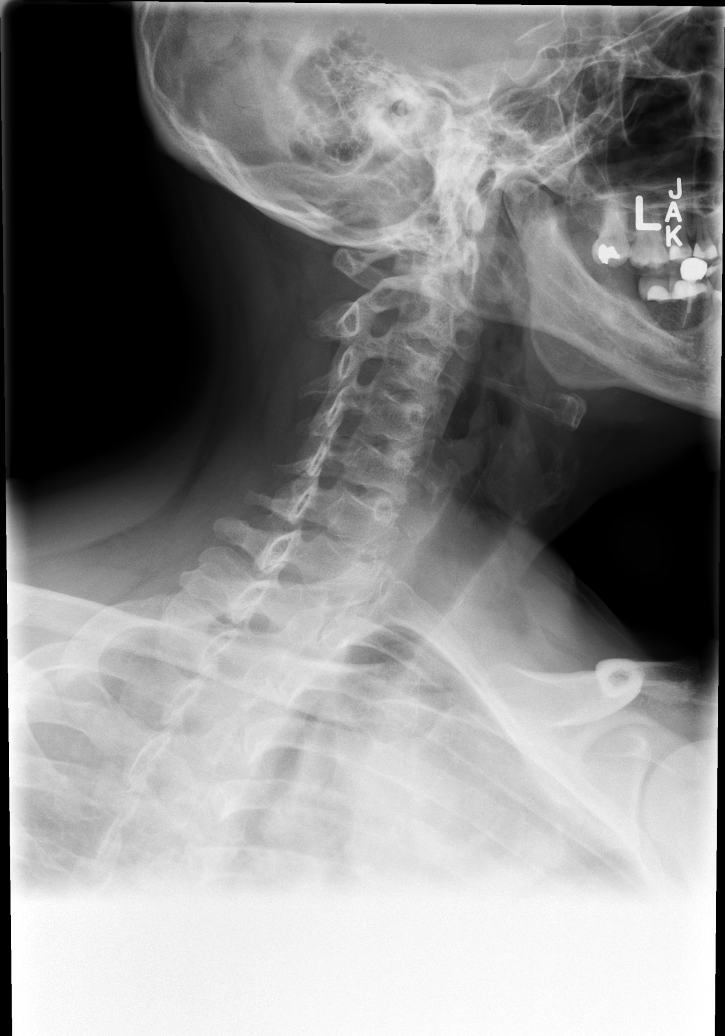

[w cervical spine ap_obl (2 of 2)]
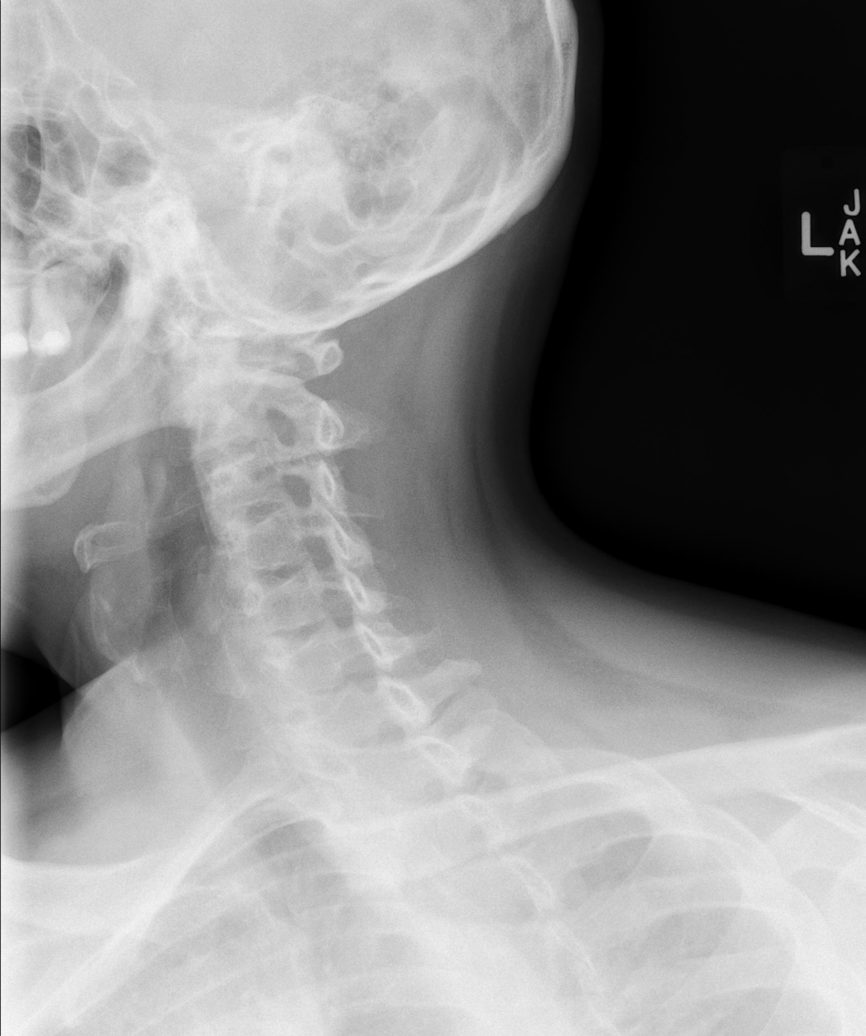

[w cervical spine ap (1 of 2)]
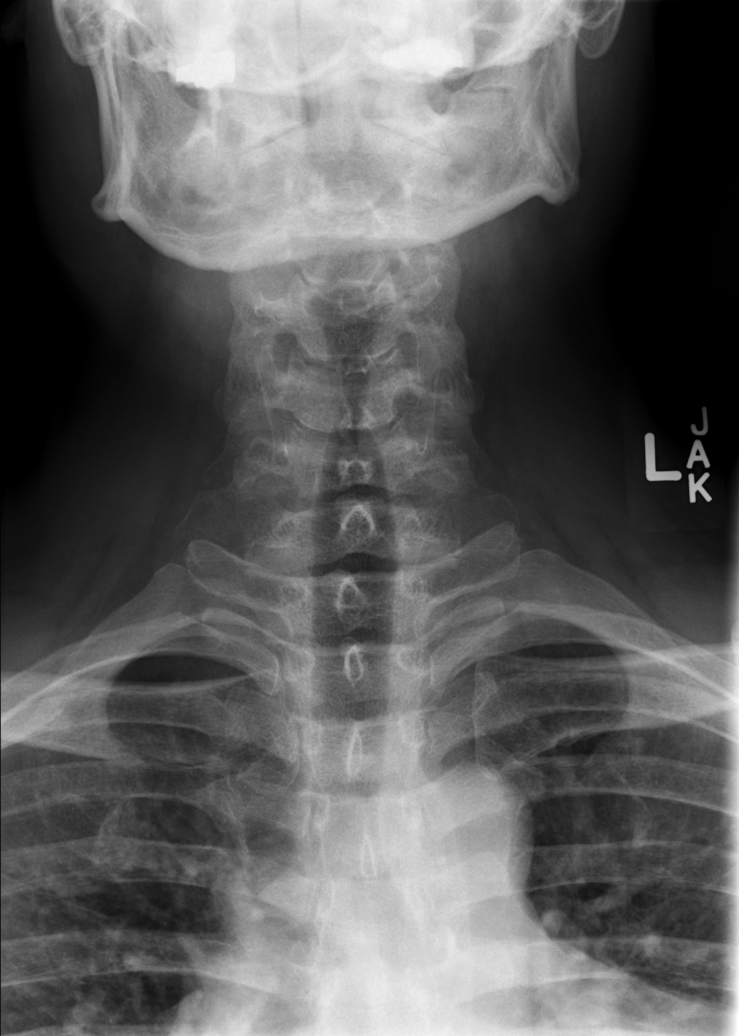

[w cervical spine ap (2 of 2)]
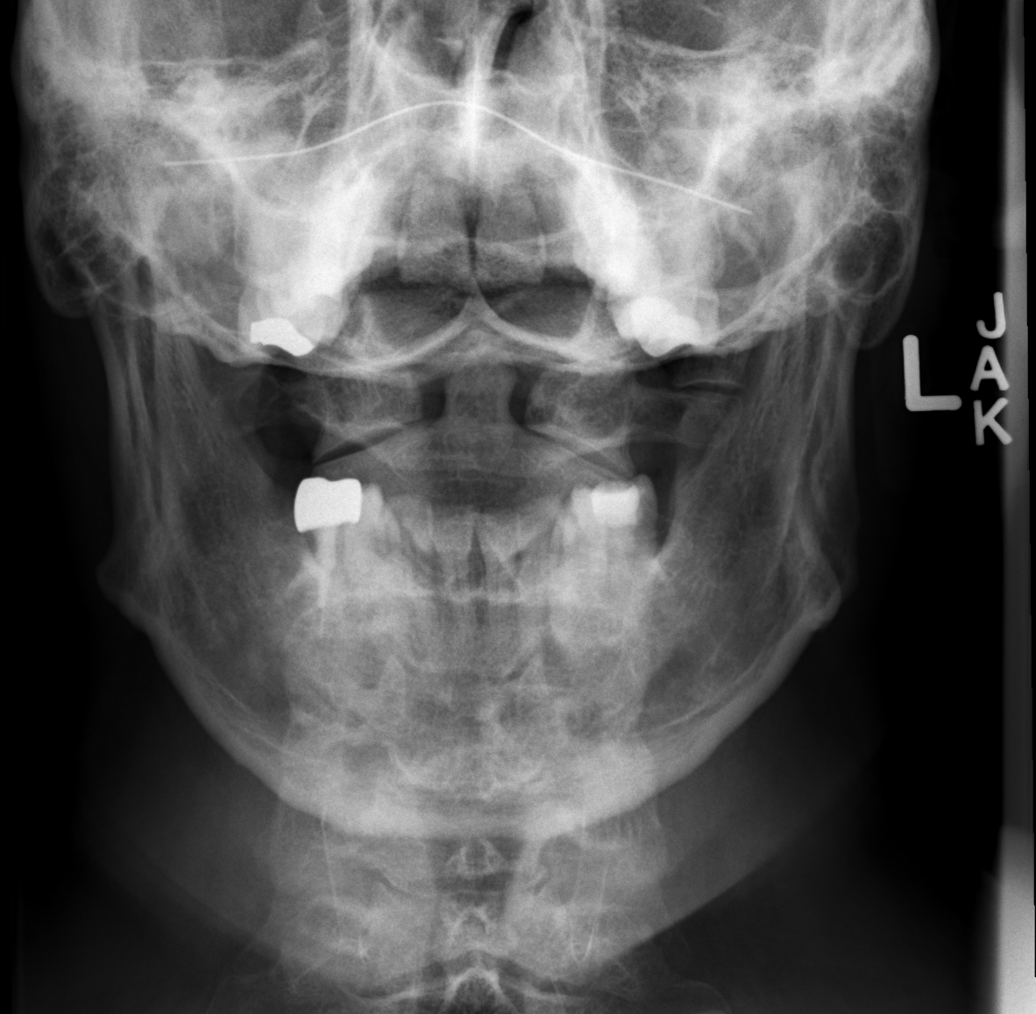

[5 of 5 positions shown; findings below may reference images not displayed]

FINDINGS: There is no evidence of cervical spine fracture or prevertebral soft
tissue swelling. Alignment is normal. No other significant bone
abnormalities are identified.
IMPRESSION: Negative cervical spine radiographs.
